# Patient Record
Sex: Male | Born: 1980 | Race: Black or African American | Hispanic: No | Marital: Married | Smoking: Former smoker
Health system: Southern US, Community
[De-identification: ages and names within clinical notes are randomized; demographics above are authoritative.]

## PROBLEM LIST (undated history)

## (undated) ENCOUNTER — Emergency Department (HOSPITAL_BASED_OUTPATIENT_CLINIC_OR_DEPARTMENT_OTHER): Admission: EM | Payer: BC Managed Care – PPO

## (undated) HISTORY — PX: CERVICAL FUSION: SHX112

## (undated) HISTORY — PX: BACK SURGERY: SHX140

---
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
## 2003-04-14 ENCOUNTER — Encounter: Payer: Self-pay | Admitting: Emergency Medicine

## 2003-04-14 ENCOUNTER — Emergency Department (HOSPITAL_COMMUNITY): Admission: EM | Admit: 2003-04-14 | Discharge: 2003-04-14 | Payer: Self-pay | Admitting: Emergency Medicine

## 2004-02-20 ENCOUNTER — Emergency Department (HOSPITAL_COMMUNITY): Admission: EM | Admit: 2004-02-20 | Discharge: 2004-02-20 | Payer: Self-pay | Admitting: Emergency Medicine

## 2007-08-11 ENCOUNTER — Emergency Department (HOSPITAL_COMMUNITY): Admission: EM | Admit: 2007-08-11 | Discharge: 2007-08-11 | Payer: Self-pay | Admitting: Emergency Medicine

## 2008-08-04 ENCOUNTER — Emergency Department (HOSPITAL_COMMUNITY): Admission: EM | Admit: 2008-08-04 | Discharge: 2008-08-04 | Payer: Self-pay | Admitting: Emergency Medicine

## 2008-08-05 ENCOUNTER — Emergency Department (HOSPITAL_COMMUNITY): Admission: EM | Admit: 2008-08-05 | Discharge: 2008-08-05 | Payer: Self-pay | Admitting: Emergency Medicine

## 2010-05-23 ENCOUNTER — Emergency Department (HOSPITAL_COMMUNITY): Admission: EM | Admit: 2010-05-23 | Discharge: 2010-05-23 | Payer: Self-pay | Admitting: Emergency Medicine

## 2010-06-20 ENCOUNTER — Emergency Department (HOSPITAL_COMMUNITY): Admission: EM | Admit: 2010-06-20 | Discharge: 2010-06-20 | Payer: Self-pay | Admitting: Emergency Medicine

## 2010-07-23 ENCOUNTER — Emergency Department (HOSPITAL_COMMUNITY)
Admission: EM | Admit: 2010-07-23 | Discharge: 2010-07-23 | Payer: Self-pay | Source: Home / Self Care | Admitting: Emergency Medicine

## 2010-10-26 LAB — URINALYSIS, ROUTINE W REFLEX MICROSCOPIC
Bilirubin Urine: NEGATIVE
Glucose, UA: NEGATIVE mg/dL
Hgb urine dipstick: NEGATIVE
Ketones, ur: NEGATIVE mg/dL
Nitrite: NEGATIVE
Protein, ur: NEGATIVE mg/dL
Specific Gravity, Urine: 1.028 (ref 1.005–1.030)
Urobilinogen, UA: 0.2 mg/dL (ref 0.0–1.0)
pH: 6 (ref 5.0–8.0)

## 2010-10-26 LAB — GC/CHLAMYDIA PROBE AMP, GENITAL
Chlamydia, DNA Probe: NEGATIVE
GC Probe Amp, Genital: NEGATIVE

## 2011-05-01 ENCOUNTER — Emergency Department (HOSPITAL_COMMUNITY)
Admission: EM | Admit: 2011-05-01 | Discharge: 2011-05-02 | Disposition: A | Discharge status: Home / Self Care | Payer: Self-pay | Attending: Emergency Medicine | Admitting: Emergency Medicine

## 2011-05-01 DIAGNOSIS — S1093XA Contusion of unspecified part of neck, initial encounter: Secondary | ICD-10-CM | POA: Insufficient documentation

## 2011-05-01 DIAGNOSIS — Y990 Civilian activity done for income or pay: Secondary | ICD-10-CM | POA: Insufficient documentation

## 2011-05-01 DIAGNOSIS — S0003XA Contusion of scalp, initial encounter: Secondary | ICD-10-CM | POA: Insufficient documentation

## 2011-05-01 DIAGNOSIS — Y9269 Other specified industrial and construction area as the place of occurrence of the external cause: Secondary | ICD-10-CM | POA: Insufficient documentation

## 2011-05-01 DIAGNOSIS — IMO0002 Reserved for concepts with insufficient information to code with codable children: Secondary | ICD-10-CM | POA: Insufficient documentation

## 2011-05-01 DIAGNOSIS — R51 Headache: Secondary | ICD-10-CM | POA: Insufficient documentation

## 2011-05-02 ENCOUNTER — Encounter (HOSPITAL_COMMUNITY): Payer: Self-pay

## 2011-05-02 ENCOUNTER — Emergency Department (HOSPITAL_COMMUNITY): Payer: Self-pay

## 2011-05-20 LAB — COMPREHENSIVE METABOLIC PANEL
ALT: 19
Albumin: 4.4
Alkaline Phosphatase: 38 — ABNORMAL LOW
Calcium: 10
GFR calc Af Amer: >60
Potassium: 4.1
Sodium: 142
Total Protein: 7.3

## 2011-05-20 LAB — OCCULT BLOOD X 1 CARD TO LAB, STOOL: Fecal Occult Bld: NEGATIVE

## 2011-05-20 LAB — CBC
Hemoglobin: 14.7 g/dL (ref 13.0–17.0)
Platelets: 189
RBC: 5.23 MIL/uL (ref 4.22–5.81)
RDW: 14.9 % (ref 11.5–15.5)
RDW: 13.8

## 2011-05-20 LAB — BASIC METABOLIC PANEL
Calcium: 9.8 mg/dL (ref 8.4–10.5)
GFR calc Af Amer: >60 mL/min (ref >60)
GFR calc Af Amer: >60 mL/min (ref >60)
GFR calc non Af Amer: >60 mL/min (ref >60)
GFR calc non Af Amer: >60 mL/min (ref >60)
Glucose, Bld: 115 mg/dL — ABNORMAL HIGH (ref 70–99)
Potassium: 4.3 mEq/L (ref 3.5–5.1)
Sodium: 139 mEq/L (ref 135–145)
Sodium: 144 mEq/L (ref 135–145)

## 2011-05-20 LAB — URINALYSIS, ROUTINE W REFLEX MICROSCOPIC
Hgb urine dipstick: NEGATIVE
Nitrite: NEGATIVE
Specific Gravity, Urine: 1.025 (ref 1.005–1.030)
Urobilinogen, UA: 1 mg/dL (ref 0.0–1.0)

## 2011-05-20 LAB — LIPASE, BLOOD: Lipase: 23 U/L (ref 11–59)

## 2011-05-20 LAB — DIFFERENTIAL
Basophils Absolute: 0 10*3/uL (ref 0.0–0.1)
Basophils Relative: 0
Eosinophils Absolute: 0
Eosinophils Relative: 0
Lymphocytes Relative: 6 % — ABNORMAL LOW (ref 12–46)
Monocytes Absolute: 1.5 10*3/uL — ABNORMAL HIGH (ref 0.1–1.0)
Monocytes Absolute: 1.9 — ABNORMAL HIGH
Neutro Abs: 13.6 10*3/uL — ABNORMAL HIGH (ref 1.7–7.7)
Neutro Abs: 21.5 — ABNORMAL HIGH

## 2011-05-20 LAB — URINE MICROSCOPIC-ADD ON

## 2011-09-02 ENCOUNTER — Emergency Department (HOSPITAL_COMMUNITY): Payer: Self-pay

## 2011-09-02 ENCOUNTER — Emergency Department (HOSPITAL_COMMUNITY)
Admission: EM | Admit: 2011-09-02 | Discharge: 2011-09-02 | Disposition: A | Discharge status: Home / Self Care | Payer: Self-pay | Attending: Emergency Medicine | Admitting: Emergency Medicine

## 2011-09-02 ENCOUNTER — Encounter (HOSPITAL_COMMUNITY): Payer: Self-pay | Admitting: *Deleted

## 2011-09-02 DIAGNOSIS — M25519 Pain in unspecified shoulder: Secondary | ICD-10-CM | POA: Insufficient documentation

## 2011-09-02 DIAGNOSIS — M79609 Pain in unspecified limb: Secondary | ICD-10-CM | POA: Insufficient documentation

## 2011-09-02 DIAGNOSIS — M25539 Pain in unspecified wrist: Secondary | ICD-10-CM | POA: Insufficient documentation

## 2011-09-02 DIAGNOSIS — IMO0001 Reserved for inherently not codable concepts without codable children: Secondary | ICD-10-CM | POA: Insufficient documentation

## 2011-09-02 MED ORDER — IBUPROFEN 800 MG PO TABS: 800.0000 mg | ORAL_TABLET | Freq: Three times a day (TID) | ORAL | Status: AC | PRN

## 2011-09-02 MED ORDER — OXYCODONE-ACETAMINOPHEN 5-325 MG PO TABS: 1.0000 | ORAL_TABLET | Freq: Four times a day (QID) | ORAL | Status: AC | PRN

## 2011-09-02 MED ORDER — IBUPROFEN 200 MG PO TABS
600.0000 mg | ORAL_TABLET | Freq: Once | ORAL | Status: AC
  Administered 2011-09-02: 600 mg via ORAL
  Filled 2011-09-02: qty 3

## 2011-09-02 MED ORDER — DIAZEPAM 5 MG PO TABS: 5.0000 mg | ORAL_TABLET | Freq: Three times a day (TID) | ORAL | Status: AC | PRN
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
## 2011-09-02 NOTE — Progress Notes (Signed)
Orthopedic Tech Progress Note Patient Details:  Roberto Goodman February 26, 1981 [id number]  Other Ortho Devices Type of Ortho Device: Other (comment) Ortho Device Location: immobilizer sling Ortho Device Interventions: Sarita Bottom 09/02/2011, 9:12 AM
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
## 2011-09-02 NOTE — ED Notes (Signed)
Patient transported to X-ray 

## 2011-09-10 NOTE — ED Provider Notes (Signed)
Medical screening examination/treatment/procedure(s) were conducted as a shared visit with non-physician practitioner(s) and myself.  I personally evaluated the patient during the encounter   Suzi Roots, MD 09/10/11 1247

## 2012-05-18 ENCOUNTER — Encounter (HOSPITAL_COMMUNITY): Payer: Self-pay | Admitting: Emergency Medicine

## 2012-05-18 ENCOUNTER — Emergency Department (HOSPITAL_COMMUNITY)
Admission: EM | Admit: 2012-05-18 | Discharge: 2012-05-18 | Disposition: A | Discharge status: Home / Self Care | Payer: Self-pay | Attending: Emergency Medicine | Admitting: Emergency Medicine

## 2012-05-18 DIAGNOSIS — G5620 Lesion of ulnar nerve, unspecified upper limb: Secondary | ICD-10-CM | POA: Insufficient documentation

## 2012-05-18 DIAGNOSIS — F17200 Nicotine dependence, unspecified, uncomplicated: Secondary | ICD-10-CM | POA: Insufficient documentation

## 2012-05-18 MED ORDER — IBUPROFEN 600 MG PO TABS: 600.0000 mg | ORAL_TABLET | Freq: Three times a day (TID) | ORAL | Status: AC

## 2012-05-18 NOTE — ED Provider Notes (Signed)
History   This chart was scribed for Roberto Munch, MD by Melba Coon. The patient was seen in room TR09C/TR09C and the patient's care was started at 1:21PM.    CSN: [id number]  Arrival date & time 05/18/12  1029   First MD Initiated Contact with Patient 05/18/12 1250      Chief Complaint  Patient presents with  . Wrist Pain    (Consider location/radiation/quality/duration/timing/severity/associated sxs/prior treatment) The history is provided by the patient. No language interpreter was used.   CRIMSON HELLE is a 31 y.o. male who presents to the Emergency Department complaining of constant, moderate to severe left wrist pain that radiates to the elbow and left hand, 3rd and 4th digits, with an onset this morning. 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 Moving the hand and wrist downward aggravates the pain. Denies HA, fever, neck pain, sore throat, rash, back pain, CP, SOB, abd pain, n/v/d, dysuria, or extremity edema, weakness, numbness, or tingling. No known allergies. No other pertinent medical symptoms.  History reviewed. No pertinent past medical history.  History reviewed. No pertinent past surgical history.  No family history on file.  History  Substance Use Topics  . Smoking status: Current Every Day Smoker  . Smokeless tobacco: Not on file  . Alcohol Use: Yes      Review of Systems 10 Systems reviewed and all are negative for acute change except as noted in the HPI.   Allergies  Review of patient's allergies indicates no known allergies.  Home Medications  No current outpatient prescriptions on file.  BP 110/59  Pulse 68  Temp 98.2 F (36.8 C)  Resp 16  SpO2 99%  Physical Exam  Nursing note and vitals reviewed. Constitutional: He is oriented to person, place, and time. He appears well-developed and well-nourished. No distress.  HENT:  Head: Normocephalic and atraumatic.    Right Ear: External ear normal.  Left Ear: External ear normal.  Eyes: EOM are normal.  Neck: Normal range of motion. Neck supple. No tracheal deviation present.  Cardiovascular: Normal rate, regular rhythm and normal heart sounds.   No murmur heard. Pulmonary/Chest: Effort normal and breath sounds normal. No respiratory distress. He has no wheezes.  Musculoskeletal: Normal range of motion.  Lymphadenopathy:    He has no cervical adenopathy.  Neurological: He is alert and oriented to person, place, and time.       Subjective sensation throughout the ulnar distribution. Sensation and strength 5/5 LUE.  Skin: Skin is warm and dry.  Psychiatric: He has a normal mood and affect. His behavior is normal.    ED Course  Procedures (including critical care time)   COORDINATION OF CARE:  1:26PM - ibuprofen will be Rx for Mr Jahoda and is advised to apply ice at home. He is ready for d/c.    Labs Reviewed - No data to display No results found.   No diagnosis found.    MDM  I personally performed the services described in this documentation, which was scribed in my presence. The recorded information has been reviewed and considered.  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  No clear precipitant, though his work is a consideration.  He was discharged in stable condition with anti-inflammatories, cryotherapy, return precautions and followup instructions.  Roberto Munch, MD 05/18/12 [phone]

## 2012-05-18 NOTE — ED Notes (Signed)
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

## 2012-09-07 ENCOUNTER — Encounter (HOSPITAL_COMMUNITY): Payer: Self-pay

## 2012-09-07 ENCOUNTER — Emergency Department (HOSPITAL_COMMUNITY)
Admission: EM | Admit: 2012-09-07 | Discharge: 2012-09-07 | Disposition: A | Discharge status: Home / Self Care | Payer: Self-pay | Source: Home / Self Care | Attending: Family Medicine | Admitting: Family Medicine

## 2012-09-07 DIAGNOSIS — H00019 Hordeolum externum unspecified eye, unspecified eyelid: Secondary | ICD-10-CM

## 2012-09-07 MED ORDER — DOXYCYCLINE HYCLATE 100 MG PO CAPS: 100.0000 mg | ORAL_CAPSULE | Freq: Two times a day (BID) | ORAL | Status: DC

## 2012-09-07 MED ORDER — POLYMYXIN B-TRIMETHOPRIM 10000-0.1 UNIT/ML-% OP SOLN: 1.0000 [drp] | OPHTHALMIC | Status: DC

## 2012-09-07 NOTE — ED Provider Notes (Signed)
History     CSN: [id number]  Arrival date & time 09/07/12  1735   First MD Initiated Contact with Patient 09/07/12 1834     Chief Complaint  Patient presents with  . Eye Pain    Patient is a 32 y.o. male presenting with eye pain. The history is provided by the patient.  Eye Pain  stye involving the left eye started 1 week ago, been using warm compresses but no improvement and getting bigger, not associated with pain but is starting to affect peripheral vision as pt can see it.  Pt says that he has no fever or chills.     History reviewed. No pertinent past medical history.  History reviewed. No pertinent past surgical history.  No family history on file.  History  Substance Use Topics  . Smoking status: Current Every Day Smoker  . Smokeless tobacco: Not on file  . Alcohol Use: Yes      Review of Systems  Eyes: Positive for redness and visual disturbance. Negative for pain.       Bump under left eye  All other systems reviewed and are negative.    Allergies  Review of patient's allergies indicates no known allergies.  Home Medications  No current outpatient prescriptions on file.  BP 106/56  Pulse 60  Temp 98.2 F (36.8 C) (Oral)  Resp 17  SpO2 99%  Physical Exam  Nursing note and vitals reviewed. Constitutional: He appears well-developed and well-nourished. No distress.  HENT:  Head: Normocephalic and atraumatic.  Eyes: EOM are normal. Pupils are equal, round, and reactive to light. Left eye exhibits no exudate. No foreign body present in the left eye.      ED Course  Procedures (including critical care time)  Labs Reviewed - No data to display No results found.   No diagnosis found.  MDM  IMPRESSION  Stye left eye  RECOMMENDATIONS / PLAN Doxycycline 100 mg po bid  polytrim eye drops  RTC if no improvement or worsening in 3 days...the patient verbalized understanding  FOLLOW UP 3 days if no improvement or worsening   The patient was  given clear instructions to go to ER or return to medical center if symptoms don't improve, worsen or new problems develop.  The patient verbalized understanding.  The patient was told to call to get lab results if they haven't heard anything in the next week.            Cleora Fleet, MD 09/07/12 1929

## 2012-09-07 NOTE — ED Notes (Signed)
Patient states left lower eye lid swollen almost a week denies pain , causing some blurred vision, swelling to left side of face

## 2012-09-15 ENCOUNTER — Encounter (HOSPITAL_COMMUNITY): Payer: Self-pay | Admitting: Emergency Medicine

## 2012-09-15 ENCOUNTER — Emergency Department (INDEPENDENT_AMBULATORY_CARE_PROVIDER_SITE_OTHER)
Admission: EM | Admit: 2012-09-15 | Discharge: 2012-09-15 | Disposition: A | Discharge status: Home / Self Care | Payer: Self-pay | Source: Home / Self Care | Attending: Emergency Medicine | Admitting: Emergency Medicine

## 2012-09-15 DIAGNOSIS — H00029 Hordeolum internum unspecified eye, unspecified eyelid: Secondary | ICD-10-CM

## 2012-09-15 DIAGNOSIS — H00019 Hordeolum externum unspecified eye, unspecified eyelid: Secondary | ICD-10-CM

## 2012-09-15 MED ORDER — ERYTHROMYCIN 5 MG/GM OP OINT: TOPICAL_OINTMENT | Freq: Four times a day (QID) | OPHTHALMIC | Status: DC

## 2012-09-15 NOTE — ED Provider Notes (Signed)
Medical screening examination/treatment/procedure(s) were performed by non-physician practitioner and as supervising physician I was immediately available for consultation/collaboration.  Leslee Home, M.D.   Reuben Likes, MD 09/15/12 [phone]

## 2012-09-15 NOTE — ED Provider Notes (Signed)
History     CSN: [id number]  Arrival date & time 09/15/12  1155   First MD Initiated Contact with Patient 09/15/12 1424      Chief Complaint  Patient presents with  . Facial Swelling    left lower eye lid swelling.     (Consider location/radiation/quality/duration/timing/severity/associated sxs/prior treatment) Patient is a 32 y.o. male presenting with eye pain. The history is provided by the patient.  Eye Pain This is a new problem. The problem has not changed since onset.Treatments tried: oral antibiotics.  Pt presents for follow up of left eye stye.  States currently on doxycyline and polymyxin as previously prescribed with no change in symptoms for one week.  Has additionally applied warm compresses.  Denies previous history of same.  Not diabetic, no previous eye surgery/trauma, denies cataracts or glaucoma. No floaters or flashing lights No vision loss + blurred vision No eye pain + eyelid itching + tearing No headache/scalp tenderness Does not wear contacts or glasses.  History reviewed. No pertinent past medical history.  History reviewed. No pertinent past surgical history.  History reviewed. No pertinent family history.  History  Substance Use Topics  . Smoking status: Current Every Day Smoker  . Smokeless tobacco: Not on file  . Alcohol Use: Yes      Review of Systems  Eyes: Positive for pain, redness, itching and visual disturbance. Negative for discharge.  All other systems reviewed and are negative.    Allergies  Review of patient's allergies indicates no known allergies.  Home Medications   Current Outpatient Rx  Name  Route  Sig  Dispense  Refill  . DOXYCYCLINE HYCLATE 100 MG PO CAPS   Oral   Take 1 capsule (100 mg total) by mouth 2 (two) times daily with a meal.   14 capsule   0   . ERYTHROMYCIN 5 MG/GM OP OINT   Left Eye   Place into the left eye every 6 (six) hours. For seven days   3.5 g   0     BP 117/73  Pulse 71  Temp 98.1  F (36.7 C) (Oral)  Resp 17  SpO2 98%  Physical Exam  Nursing note and vitals reviewed. Constitutional: He is oriented to person, place, and time. Vital signs are normal. He appears well-developed and well-nourished. He is active and cooperative.  HENT:  Head: Normocephalic.  Eyes: Conjunctivae normal and EOM are normal. Pupils are equal, round, and reactive to light. No foreign bodies found. Right eye exhibits no chemosis, no discharge, no exudate and no hordeolum. No foreign body present in the right eye. Left eye exhibits hordeolum. Left eye exhibits no chemosis, no discharge and no exudate. No foreign body present in the left eye. No scleral icterus.    Neck: Trachea normal. Neck supple.  Cardiovascular: Normal rate, regular rhythm and normal heart sounds.   Pulmonary/Chest: Effort normal and breath sounds normal.  Neurological: He is alert and oriented to person, place, and time. He has normal strength. No cranial nerve deficit or sensory deficit. GCS eye subscore is 4. GCS verbal subscore is 5. GCS motor subscore is 6.  Skin: Skin is warm and dry.  Psychiatric: He has a normal mood and affect. His speech is normal and behavior is normal. Judgment and thought content normal. Cognition and memory are normal.    ED Course  Procedures (including critical care time)  Labs Reviewed - No data to display No results found.   1. Hordeolum eyelid, internal  MDM  Cleanse eyelid with baby shampoo.  Apply warm compresses four times daily. Follow-up with ophthalmologist within 1-2 days if the condition is not resolved completely with prescribed management.topical antibiotic         Johnsie Kindred, NP 09/15/12 1501

## 2012-09-15 NOTE — ED Notes (Signed)
Pt was seen on 1/24 for left lower eye lid swelling. Pt states that he is taking medication as prescribed with no relief in symptoms. Pt states that it is gradually getting worse and having some irritation, denies drainage.

## 2012-09-15 NOTE — ED Notes (Signed)
Waiting discharge papers 

## 2012-12-23 ENCOUNTER — Emergency Department (HOSPITAL_COMMUNITY)
Admission: EM | Admit: 2012-12-23 | Discharge: 2012-12-23 | Disposition: A | Discharge status: Home / Self Care | Payer: Medicaid Other | Attending: Emergency Medicine | Admitting: Emergency Medicine

## 2012-12-23 ENCOUNTER — Encounter (HOSPITAL_COMMUNITY): Payer: Self-pay | Admitting: *Deleted

## 2012-12-23 DIAGNOSIS — R112 Nausea with vomiting, unspecified: Secondary | ICD-10-CM | POA: Insufficient documentation

## 2012-12-23 DIAGNOSIS — R197 Diarrhea, unspecified: Secondary | ICD-10-CM | POA: Insufficient documentation

## 2012-12-23 DIAGNOSIS — K529 Noninfective gastroenteritis and colitis, unspecified: Secondary | ICD-10-CM

## 2012-12-23 DIAGNOSIS — F17200 Nicotine dependence, unspecified, uncomplicated: Secondary | ICD-10-CM | POA: Insufficient documentation

## 2012-12-23 DIAGNOSIS — K5289 Other specified noninfective gastroenteritis and colitis: Secondary | ICD-10-CM | POA: Insufficient documentation

## 2012-12-23 LAB — CBC WITH DIFFERENTIAL/PLATELET
Basophils Absolute: 0 10*3/uL (ref 0.0–0.1)
Eosinophils Absolute: 0 10*3/uL (ref 0.0–0.7)
Eosinophils Relative: 0 % (ref 0–5)
HCT: 40 % (ref 39.0–52.0)
Lymphocytes Relative: 8 % — ABNORMAL LOW (ref 12–46)
MCH: 27.7 pg (ref 26.0–34.0)
MCV: 77.5 fL — ABNORMAL LOW (ref 78.0–100.0)
Monocytes Absolute: 0.7 10*3/uL (ref 0.1–1.0)
Platelets: 220 10*3/uL (ref 150–400)
RDW: 13.9 % (ref 11.5–15.5)

## 2012-12-23 LAB — URINALYSIS, ROUTINE W REFLEX MICROSCOPIC
Nitrite: NEGATIVE
Protein, ur: NEGATIVE mg/dL
Specific Gravity, Urine: 1.025 (ref 1.005–1.030)
Urobilinogen, UA: 0.2 mg/dL (ref 0.0–1.0)

## 2012-12-23 LAB — URINE MICROSCOPIC-ADD ON

## 2012-12-23 LAB — COMPREHENSIVE METABOLIC PANEL
ALT: 18 U/L (ref 0–53)
CO2: 23 mEq/L (ref 19–32)
Calcium: 10.2 mg/dL (ref 8.4–10.5)
Creatinine, Ser: 0.88 mg/dL (ref 0.50–1.35)
GFR calc Af Amer: >90 mL/min (ref >90)
GFR calc non Af Amer: >90 mL/min (ref >90)
Glucose, Bld: 150 mg/dL — ABNORMAL HIGH (ref 70–99)
Sodium: 138 mEq/L (ref 135–145)
Total Protein: 7.7 g/dL (ref 6.0–8.3)

## 2012-12-23 MED ORDER — SODIUM CHLORIDE 0.9 % IV BOLUS (SEPSIS): 1000.0000 mL | Freq: Once | INTRAVENOUS | Status: DC

## 2012-12-23 MED ORDER — ONDANSETRON HCL 8 MG PO TABS: 8.0000 mg | ORAL_TABLET | Freq: Three times a day (TID) | ORAL | Status: DC | PRN

## 2012-12-23 MED ORDER — ONDANSETRON 4 MG PO TBDP
4.0000 mg | ORAL_TABLET | Freq: Once | ORAL | Status: AC
  Administered 2012-12-23: 4 mg via ORAL
  Filled 2012-12-23: qty 1

## 2012-12-23 MED ORDER — SODIUM CHLORIDE 0.9 % IV BOLUS (SEPSIS)
1000.0000 mL | Freq: Once | INTRAVENOUS | Status: AC
  Administered 2012-12-23: 1000 mL via INTRAVENOUS

## 2012-12-23 MED ORDER — HYDROMORPHONE HCL PF 1 MG/ML IJ SOLN
1.0000 mg | Freq: Once | INTRAMUSCULAR | Status: AC
  Administered 2012-12-23: 1 mg via INTRAVENOUS
  Filled 2012-12-23: qty 1

## 2012-12-23 MED ORDER — ONDANSETRON HCL 4 MG/2ML IJ SOLN
4.0000 mg | Freq: Once | INTRAMUSCULAR | Status: AC
  Administered 2012-12-23: 4 mg via INTRAVENOUS
  Filled 2012-12-23: qty 2

## 2012-12-23 NOTE — ED Provider Notes (Signed)
History     CSN: [id number]  Arrival date & time 12/23/12  4098   First MD Initiated Contact with Patient 12/23/12 1047      Chief Complaint  Patient presents with  . Abdominal Pain    (Consider location/radiation/quality/duration/timing/severity/associated sxs/prior treatment) Patient is a 32 y.o. male presenting with abdominal pain. The history is provided by the patient.  Abdominal Pain Associated symptoms: diarrhea and vomiting   Associated symptoms: no chest pain, no chills, no fever and no shortness of breath   pt c/o nvd onset yesterday afternoon. Several episodes of both vomiting and diarrhea. Emesis clear, or color recently ingested fluids, not bloody or bilious. Diarrhea watery, not bloody. Diffuse, intermittent, abd cramping/pain, no constant or focal abd pain. No back/flank pain. No dysuria or gu c/o. No recent travel, known bad food ingestion, recent ill contacts, no abx use. No cough or uri c/o. No cp or sob. No prior abd surgery, no abd distension.    History reviewed. No pertinent past medical history.  History reviewed. No pertinent past surgical history.  No family history on file.  History  Substance Use Topics  . Smoking status: Current Every Day Smoker  . Smokeless tobacco: Not on file  . Alcohol Use: Yes      Review of Systems  Constitutional: Negative for fever and chills.  HENT: Negative for neck pain.   Eyes: Negative for redness.  Respiratory: Negative for shortness of breath.   Cardiovascular: Negative for chest pain.  Gastrointestinal: Positive for vomiting, abdominal pain and diarrhea.  Genitourinary: Negative for flank pain.  Musculoskeletal: Negative for back pain.  Skin: Negative for rash.  Neurological: Negative for headaches.  Hematological: Does not bruise/bleed easily.  Psychiatric/Behavioral: Negative for confusion.    Allergies  Review of patient's allergies indicates no known allergies.  Home Medications  No current  outpatient prescriptions on file.  BP 126/68  Pulse 64  Temp(Src) 98.3 F (36.8 C) (Oral)  Resp 24  Ht 5\' 7"  (1.702 m)  Wt 142 lb (64.411 kg)  BMI 22.24 kg/m2  SpO2 100%  Physical Exam  Nursing note and vitals reviewed. Constitutional: He is oriented to person, place, and time. He appears well-developed and well-nourished. No distress.  HENT:  Nose: Nose normal.  Mouth/Throat: Oropharynx is clear and moist.  Eyes: Conjunctivae are normal. No scleral icterus.  Neck: Neck supple. No tracheal deviation present.  No stiffness or rigidity  Cardiovascular: Normal rate, regular rhythm, normal heart sounds and intact distal pulses.  Exam reveals no gallop and no friction rub.   No murmur heard. Pulmonary/Chest: Effort normal and breath sounds normal. No accessory muscle usage. No respiratory distress.  Abdominal: Soft. Bowel sounds are normal. He exhibits no distension and no mass. There is no tenderness. There is no rebound and no guarding.  Genitourinary:  No cva tenderness  Musculoskeletal: Normal range of motion. He exhibits no edema and no tenderness.  Lymphadenopathy:    He has no cervical adenopathy.  Neurological: He is alert and oriented to person, place, and time.  Skin: Skin is warm and dry. No rash noted.  Psychiatric: He has a normal mood and affect.    ED Course  Procedures (including critical care time)   Results for orders placed during the hospital encounter of 12/23/12  CBC WITH DIFFERENTIAL      Result Value Range   WBC 17.2 (*) 4.0 - 10.5 K/uL   RBC 5.16  4.22 - 5.81 MIL/uL   Hemoglobin 14.3  13.0 - 17.0 g/dL   HCT 47.8  29.5 - 62.1 %   MCV 77.5 (*) 78.0 - 100.0 fL   MCH 27.7  26.0 - 34.0 pg   MCHC 35.8  30.0 - 36.0 g/dL   RDW 30.8  65.7 - 84.6 %   Platelets 220  150 - 400 K/uL   Neutrophils Relative 88 (*) 43 - 77 %   Neutro Abs 15.1 (*) 1.7 - 7.7 K/uL   Lymphocytes Relative 8 (*) 12 - 46 %   Lymphs Abs 1.4  0.7 - 4.0 K/uL   Monocytes Relative 4  3  - 12 %   Monocytes Absolute 0.7  0.1 - 1.0 K/uL   Eosinophils Relative 0  0 - 5 %   Eosinophils Absolute 0.0  0.0 - 0.7 K/uL   Basophils Relative 0  0 - 1 %   Basophils Absolute 0.0  0.0 - 0.1 K/uL  COMPREHENSIVE METABOLIC PANEL      Result Value Range   Sodium 138  135 - 145 mEq/L   Potassium 4.1  3.5 - 5.1 mEq/L   Chloride 101  96 - 112 mEq/L   CO2 23  19 - 32 mEq/L   Glucose, Bld 150 (*) 70 - 99 mg/dL   BUN 15  6 - 23 mg/dL   Creatinine, Ser 9.62  0.50 - 1.35 mg/dL   Calcium 95.2  8.4 - 84.1 mg/dL   Total Protein 7.7  6.0 - 8.3 g/dL   Albumin 4.7  3.5 - 5.2 g/dL   AST 23  0 - 37 U/L   ALT 18  0 - 53 U/L   Alkaline Phosphatase 44  39 - 117 U/L   Total Bilirubin 1.2  0.3 - 1.2 mg/dL   GFR calc non Af Amer >90  >90 mL/min   GFR calc Af Amer >90  >90 mL/min  LIPASE, BLOOD      Result Value Range   Lipase 16  11 - 59 U/L      MDM  Iv ns bolus. zofran iv.  Dilaudid 1 mg iv for pain.  Reviewed nursing notes and prior charts for additional history.   Recheck pt comfortable.  abd soft nt on recheck.  Additional ivf.  Po fluids.  No recurrent nvd.   Pt appears stable for d/c.         Suzi Roots, MD 12/23/12 [phone]

## 2012-12-23 NOTE — ED Notes (Signed)
Pt is here with lower abdominal pain, vomiting, and diarrhea that started yesterday.  Pt states he feels weak

## 2012-12-23 NOTE — ED Notes (Signed)
Family at bedside. 

## 2012-12-24 ENCOUNTER — Encounter (HOSPITAL_COMMUNITY): Payer: Self-pay | Admitting: *Deleted

## 2012-12-24 ENCOUNTER — Emergency Department (HOSPITAL_COMMUNITY): Payer: Medicaid Other

## 2012-12-24 ENCOUNTER — Inpatient Hospital Stay (HOSPITAL_COMMUNITY)
Admission: EM | Admit: 2012-12-24 | Discharge: 2012-12-26 | DRG: 392 | Disposition: A | Discharge status: Home / Self Care | Payer: Medicaid Other | Attending: General Surgery | Admitting: General Surgery

## 2012-12-24 DIAGNOSIS — K5289 Other specified noninfective gastroenteritis and colitis: Principal | ICD-10-CM | POA: Diagnosis present

## 2012-12-24 DIAGNOSIS — R1011 Right upper quadrant pain: Secondary | ICD-10-CM

## 2012-12-24 DIAGNOSIS — F1210 Cannabis abuse, uncomplicated: Secondary | ICD-10-CM | POA: Diagnosis present

## 2012-12-24 DIAGNOSIS — F17200 Nicotine dependence, unspecified, uncomplicated: Secondary | ICD-10-CM | POA: Diagnosis present

## 2012-12-24 LAB — POCT I-STAT TROPONIN I: Troponin i, poc: 0 ng/mL (ref 0.00–0.08)

## 2012-12-24 LAB — RAPID URINE DRUG SCREEN, HOSP PERFORMED
Amphetamines: NOT DETECTED
Tetrahydrocannabinol: POSITIVE — AB

## 2012-12-24 LAB — URINE MICROSCOPIC-ADD ON

## 2012-12-24 LAB — URINALYSIS, ROUTINE W REFLEX MICROSCOPIC
Glucose, UA: NEGATIVE mg/dL
Hgb urine dipstick: NEGATIVE
Ketones, ur: 40 mg/dL — AB
pH: 7.5 (ref 5.0–8.0)

## 2012-12-24 LAB — CBC WITH DIFFERENTIAL/PLATELET
Basophils Absolute: 0 10*3/uL (ref 0.0–0.1)
Basophils Relative: 0 % (ref 0–1)
Eosinophils Absolute: 0 10*3/uL (ref 0.0–0.7)
MCH: 27.5 pg (ref 26.0–34.0)
MCHC: 35.1 g/dL (ref 30.0–36.0)
Monocytes Relative: 7 % (ref 3–12)
Neutro Abs: 14.7 10*3/uL — ABNORMAL HIGH (ref 1.7–7.7)
Neutrophils Relative %: 85 % — ABNORMAL HIGH (ref 43–77)
Platelets: 224 10*3/uL (ref 150–400)
RDW: 14.5 % (ref 11.5–15.5)

## 2012-12-24 LAB — COMPREHENSIVE METABOLIC PANEL
AST: 28 U/L (ref 0–37)
Albumin: 4.1 g/dL (ref 3.5–5.2)
Alkaline Phosphatase: 38 U/L — ABNORMAL LOW (ref 39–117)
BUN: 12 mg/dL (ref 6–23)
Potassium: 3.4 mEq/L — ABNORMAL LOW (ref 3.5–5.1)
Sodium: 140 mEq/L (ref 135–145)
Total Protein: 7 g/dL (ref 6.0–8.3)

## 2012-12-24 LAB — OCCULT BLOOD, POC DEVICE: Fecal Occult Bld: NEGATIVE

## 2012-12-24 LAB — LIPASE, BLOOD: Lipase: 28 U/L (ref 11–59)

## 2012-12-24 MED ORDER — HYDROMORPHONE HCL PF 1 MG/ML IJ SOLN
1.0000 mg | Freq: Once | INTRAMUSCULAR | Status: AC
  Administered 2012-12-24: 1 mg via INTRAVENOUS
  Filled 2012-12-24: qty 1

## 2012-12-24 MED ORDER — SODIUM CHLORIDE 0.9 % IV BOLUS (SEPSIS)
1000.0000 mL | Freq: Once | INTRAVENOUS | Status: AC
  Administered 2012-12-24: 1000 mL via INTRAVENOUS

## 2012-12-24 MED ORDER — PANTOPRAZOLE SODIUM 40 MG IV SOLR
40.0000 mg | Freq: Every day | INTRAVENOUS | Status: DC
  Administered 2012-12-25 (×2): 40 mg via INTRAVENOUS
  Filled 2012-12-24 (×4): qty 40

## 2012-12-24 MED ORDER — ACETAMINOPHEN 325 MG PO TABS: 650.0000 mg | ORAL_TABLET | Freq: Four times a day (QID) | ORAL | Status: DC | PRN

## 2012-12-24 MED ORDER — ONDANSETRON HCL 4 MG/2ML IJ SOLN
4.0000 mg | Freq: Once | INTRAMUSCULAR | Status: AC
  Administered 2012-12-24: 4 mg via INTRAVENOUS
  Filled 2012-12-24: qty 2

## 2012-12-24 MED ORDER — SODIUM CHLORIDE 0.9 % IV SOLN: 80.0000 mg | Freq: Once | INTRAVENOUS | Status: DC

## 2012-12-24 MED ORDER — IOHEXOL 300 MG/ML  SOLN
25.0000 mL | INTRAMUSCULAR | Status: AC
  Administered 2012-12-24 (×2): 25 mL via ORAL

## 2012-12-24 MED ORDER — HYDROMORPHONE HCL PF 1 MG/ML IJ SOLN
1.0000 mg | INTRAMUSCULAR | Status: DC | PRN
  Administered 2012-12-24: 1 mg via INTRAVENOUS
  Filled 2012-12-24: qty 1

## 2012-12-24 MED ORDER — IOHEXOL 300 MG/ML  SOLN
100.0000 mL | Freq: Once | INTRAMUSCULAR | Status: AC | PRN
  Administered 2012-12-24: 100 mL via INTRAVENOUS

## 2012-12-24 MED ORDER — PIPERACILLIN-TAZOBACTAM 3.375 G IVPB
3.3750 g | Freq: Three times a day (TID) | INTRAVENOUS | Status: DC
  Administered 2012-12-24 – 2012-12-26 (×5): 3.375 g via INTRAVENOUS
  Filled 2012-12-24 (×9): qty 50

## 2012-12-24 MED ORDER — SODIUM CHLORIDE 0.9 % IV SOLN
INTRAVENOUS | Status: DC
  Administered 2012-12-25 – 2012-12-26 (×4): via INTRAVENOUS

## 2012-12-24 MED ORDER — METRONIDAZOLE IVPB CUSTOM
2000.0000 mg | INTRAVENOUS | Status: AC
  Administered 2012-12-24: 2000 mg via INTRAVENOUS
  Filled 2012-12-24: qty 400

## 2012-12-24 MED ORDER — GI COCKTAIL ~~LOC~~
30.0000 mL | Freq: Once | ORAL | Status: AC
  Administered 2012-12-24: 30 mL via ORAL
  Filled 2012-12-24: qty 30

## 2012-12-24 MED ORDER — ONDANSETRON HCL 4 MG/2ML IJ SOLN: 4.0000 mg | Freq: Four times a day (QID) | INTRAMUSCULAR | Status: DC | PRN

## 2012-12-24 MED ORDER — FAMOTIDINE IN NACL 20-0.9 MG/50ML-% IV SOLN
20.0000 mg | Freq: Once | INTRAVENOUS | Status: AC
  Administered 2012-12-24: 20 mg via INTRAVENOUS
  Filled 2012-12-24: qty 50

## 2012-12-24 MED ORDER — HYDROMORPHONE HCL PF 1 MG/ML IJ SOLN
1.0000 mg | INTRAMUSCULAR | Status: DC | PRN
  Administered 2012-12-25 (×3): 1 mg via INTRAVENOUS
  Filled 2012-12-24 (×3): qty 1

## 2012-12-24 MED ORDER — HEPARIN SODIUM (PORCINE) 5000 UNIT/ML IJ SOLN
5000.0000 [IU] | Freq: Three times a day (TID) | INTRAMUSCULAR | Status: DC
  Administered 2012-12-25 – 2012-12-26 (×5): 5000 [IU] via SUBCUTANEOUS
  Filled 2012-12-24 (×8): qty 1

## 2012-12-24 MED ORDER — ACETAMINOPHEN 650 MG RE SUPP: 650.0000 mg | Freq: Four times a day (QID) | RECTAL | Status: DC | PRN

## 2012-12-24 NOTE — ED Provider Notes (Signed)
History     CSN: [id number] Arrival date & time 12/24/12  4098  First MD Initiated Contact with Patient 12/24/12 [phone]    Chief Complaint  Patient presents with  . Abdominal Pain   HPI Comments: 32 yo otherwise healthy male presenting with diffuse abdominal pain worse in upper abdomen.  Was seen yesterday for similar + vomiting and dx with gastroenteritis.  Now worsening pain but no further vomiting.  Reports 1 dark foul smelling BM yesterday but no reoccurance.  No blood streaked emesis.  No known hx of PUD but does report some GERD like symptoms.  Denies NSAID/Goodies powder use.    Patient is a 32 y.o. male presenting with abdominal pain.  Abdominal Pain Pain location:  Epigastric and periumbilical Pain quality: aching, burning and gnawing   Pain quality: not cramping, not sharp, not shooting, not squeezing, not stabbing, not tearing and not throbbing   Pain radiates to:  Does not radiate Pain severity:  Severe (10/10) Onset quality:  Gradual Duration:  48 hours Timing:  Constant Progression:  Worsening Chronicity:  New Context: retching   Context: not alcohol use, not eating, not previous surgeries and not recent illness   Relieved by:  Nothing Worsened by:  Nothing tried Associated symptoms: anorexia, melena (dark foul smelling stool X 1 yesterday) and vomiting (yesterday but resolved today)   Associated symptoms: no chest pain, no chills, no cough, no diarrhea, no dysuria, no fatigue, no fever, no hematemesis, no hematochezia, no nausea and no shortness of breath   Risk factors: no alcohol abuse and no NSAID use     History reviewed. No pertinent past medical history.  History reviewed. No pertinent past surgical history.  No family history on file.  History  Substance Use Topics  . Smoking status: Current Every Day Smoker  . Smokeless tobacco: Not on file  . Alcohol Use: Yes    Review of Systems  Constitutional: Negative for fever, chills and fatigue.  Eyes:  Negative.   Respiratory: Negative for cough, chest tightness and shortness of breath.   Cardiovascular: Negative for chest pain.  Gastrointestinal: Positive for vomiting (yesterday but resolved today), abdominal pain, melena (dark foul smelling stool X 1 yesterday) and anorexia. Negative for nausea, diarrhea, blood in stool, hematochezia, anal bleeding and hematemesis.  Genitourinary: Negative for dysuria, urgency, frequency, flank pain and decreased urine volume.  Musculoskeletal: Negative for back pain.  Skin: Negative.   Allergic/Immunologic: Negative.   Neurological: Negative.  Negative for syncope and weakness.  Psychiatric/Behavioral: Negative for behavioral problems, confusion and agitation.    Allergies  Review of patient's allergies indicates no known allergies.  Home Medications   Current Outpatient Rx  Name  Route  Sig  Dispense  Refill  . ondansetron (ZOFRAN) 8 MG tablet   Oral   Take 1 tablet (8 mg total) by mouth every 8 (eight) hours as needed for nausea.   8 tablet   0     BP 136/81  Pulse 77  Temp(Src) 97.8 F (36.6 C) (Oral)  Resp 14  SpO2 100%  Physical Exam  Nursing note and vitals reviewed. Constitutional: He appears well-developed and well-nourished. He appears distressed (moderately uncomfortable.  No respiratory distress.).  HENT:  Head: Normocephalic and atraumatic.  Mouth/Throat: No oropharyngeal exudate.  Mildly dry mucous membranes  Eyes: Conjunctivae are normal. Right eye exhibits no discharge. Left eye exhibits no discharge. No scleral icterus.  Neck: No JVD present. No tracheal deviation present.  Cardiovascular: Normal rate, regular rhythm,  normal heart sounds and intact distal pulses.  Exam reveals no gallop and no friction rub.   No murmur heard. Pulmonary/Chest: Effort normal and breath sounds normal. No respiratory distress. He has no wheezes. He has no rales.  Abdominal: Soft. He exhibits no distension and no mass. There is tenderness  (severe diffusely with worse in upper hemi-abdomen). There is rebound and guarding.  No rigidity  Genitourinary: Rectum normal. Rectal exam shows no internal hemorrhoid, no fissure, no mass and no tenderness. Prostate is not enlarged and not tender. Circumcised. No phimosis, paraphimosis, hypospadias, penile erythema or penile tenderness. Discharge (minimal urethral) found.  Musculoskeletal: Normal range of motion. He exhibits no edema and no tenderness.  Neurological: He is alert. He exhibits normal muscle tone.  Skin: Skin is warm and dry. No rash noted. He is not diaphoretic. No erythema. No pallor.  Psychiatric: His behavior is normal. Judgment and thought content normal.  uncomfortable    ED Course  Procedures (including critical care time)  Labs Reviewed  CBC WITH DIFFERENTIAL - Abnormal; Notable for the following:    WBC 17.2 (*)    HCT 38.5 (*)    Neutrophils Relative 85 (*)    Neutro Abs 14.7 (*)    Lymphocytes Relative 8 (*)    Monocytes Absolute 1.2 (*)    All other components within normal limits  COMPREHENSIVE METABOLIC PANEL - Abnormal; Notable for the following:    Potassium 3.4 (*)    Glucose, Bld 129 (*)    Alkaline Phosphatase 38 (*)    All other components within normal limits  URINALYSIS, ROUTINE W REFLEX MICROSCOPIC - Abnormal; Notable for the following:    Ketones, ur 40 (*)    Leukocytes, UA SMALL (*)    All other components within normal limits  URINE RAPID DRUG SCREEN (HOSP PERFORMED) - Abnormal; Notable for the following:    Tetrahydrocannabinol POSITIVE (*)    All other components within normal limits  URINE CULTURE  GC/CHLAMYDIA PROBE AMP  LIPASE, BLOOD  URINE MICROSCOPIC-ADD ON  POCT I-STAT TROPONIN I  OCCULT BLOOD, POC DEVICE   US Abdomen Complete  12/24/2012  *RADIOLOGY REPORT*  Clinical Data:  Diffuse abdominal pain, right upper quadrant pain, pericholecystic fluid on preceding CT  ULTRASOUND ABDOMEN:  Technique:  Sonography of upper abdominal  structures was performed.  Comparison:  CT abdomen and pelvis 12/24/2012  Gallbladder:  Small amount of pericholecystic fluid.  Gallbladder wall appears minimally thickened.  No discrete gallstones or sonographic Murphy's sign identified.  Common bile duct:  Normal caliber 3 mm diameter appear  Liver:  Mid normal appearance.  No intrahepatic biliary dilatation.  IVC:  Normal appearance  Pancreas:  Normal appearance  Spleen:  Normal appearance, 5.8 cm length.  Right kidney:  10.7 cm length. Normal morphology without mass or hydronephrosis.  Left kidney:  10.9 cm length. Normal morphology without mass or hydronephrosis.  Aorta:  Normal caliber  Other:  Minimal pericholecystic fluid.  Small amounts of ascites were also present around the liver and in the pelvis on the preceding CT.  IMPRESSION: Mild gallbladder wall thickening, nonspecific in the setting of ascites. No discrete gallstones, sonographic Murphy's sign, or biliary dilatation identified.   Original Report Authenticated By: Ulyses Southward, M.D.    Ct Abdomen Pelvis W Contrast  12/24/2012  *RADIOLOGY REPORT*  Clinical Data: Abdominal pain, concern for appendicitis.  CT ABDOMEN AND PELVIS WITH CONTRAST  Technique:  Multidetector CT imaging of the abdomen and pelvis was performed following the  standard protocol during bolus administration of intravenous contrast.  Contrast: OMNIPAQUE IOHEXOL 300 MG/ML  SOLN  Comparison: CT abdomen 08/05/2008  Findings: Lung bases are clear.  No pericardial fluid.  There is no focal hepatic lesion.  There is periportal edema likely related to recent IV therapy.  There is pericholecystic fluid which also may relate to IV fluids.  Gallbladder wall is not thickened. The gallbladder is not distended.  The pancreas, spleen, adrenal glands, kidneys are normal.  The stomach, small bowel, cecum, and appendix are normal.  The appendix fills with gas and contrast and is not inflamed.  No abnormality of the  colon or rectosigmoid  colon. There the colon is poorly evaluated in part due to the lack of intra-abdominal fat.  Abdominal aorta is normal caliber.  No retroperitoneal periportal lymphadenopathy.  No free fluid the pelvis.  No distal ureteral stones or bladder stones.  Prostate gland appears normal.  No pelvic lymphadenopathy. Review of  bone windows demonstrates no aggressive osseous lesions.  IMPRESSION:  1.  Normal appendix 2.  Pericholecystic fluid likely relates to IV therapy but cannot include exclude cholecystitis.  Recommend correlation. 3.  Periportal edema likely relates to IV hydration therapy. 4.  No clear explanation for abdominal pain and elevated white blood cell count.   Original Report Authenticated By: Genevive Bi, M.D.    Dg Abd Acute W/chest  12/24/2012  *RADIOLOGY REPORT*  Clinical Data: 32 year old male with abdominal pain and vomiting.  ACUTE ABDOMEN SERIES (ABDOMEN 2 VIEW & CHEST 1 VIEW)  Comparison: CT abdomen and pelvis 08/05/2008.  Findings: There might be mild pectus deformity explaining subtle asymmetric increased right infrahilar opacity, on the upright view of the abdomen and medial right lung base appears normal. Elsewhere the lungs are clear. Normal cardiac size and mediastinal contours.  Visualized tracheal air column is within normal limits. No pneumothorax or pneumoperitoneum.  Nonobstructed bowel gas pattern.  Abdominal and pelvic visceral contours are within normal limits.  Occasionally EKG lead button artifact. No osseous abnormality identified.  IMPRESSION: Nonobstructed bowel gas pattern, no free air. Negative chest.   Original Report Authenticated By: Erskine Speed, M.D.    No diagnosis found.   MDM  1015 - Pt seen yesterday with gastritis.  Worsening pain after d/c.  Will start with GI cocktail, repeat labs given white count, collect repeat UA + Culture given prior Leukocytes.  Will check hemoccult given melanotic stool yesterday.  Concern for perforated ulcer.  Will check acute  abdominal series.  If negative will get CT abdomen/pelvis with contrast.  If positive free air will consult surgery.    1145 - Pt still uncomfortable.  Plain film negative for free air.  Will get abdominal CT with contrast for eval for Appendicitis.  Leukocytosis on repeat CBC.    1525 - Pain minimally improved from 10/10 > 8.5/10.  Has received 2L NS.  CT abdomen with pericholecystic fluid, normal appendix.  Consider acute cholecystitis.  Given finding will emperically start Zosyn.  Eval with RUQ [place].  Trichomonas on Urinalysis, get first void GC Chlamydia.  Treat with 2g Flagyl. Does not meet SIRS criteria so will defer blood cultures.  Administer Dilaudid and Zofran.  NPO   1650 - Pt still uncomfortable.  Has been unable to produce BM and missed void > 1 hour ago.  Rectal performed with hemoccult and urethral swab GC/Chlamydia collected.  [place] with thickened gallbladder.  Consult to general surgery given persistent pain and US/CT findings.  Negative  Fecal occult.  42 - Dr. Dwain Sarna has agreed to evaluate the patient.  We appreciate the consult.     Andrena Mews, DO 12/24/12 1742

## 2012-12-24 NOTE — Progress Notes (Signed)
ANTIBIOTIC CONSULT NOTE - INITIAL  Pharmacy Consult for Zosyn Indication: Possible Acute Cholecystitis   No Known Allergies  Patient Measurements:   Vital Signs: Temp: 97.8 F (36.6 C) (05/12 1517) Temp src: Oral (05/12 1517) BP: 136/81 mmHg (05/12 1517) Pulse Rate: 77 (05/12 1517) Intake/Output from previous day:   Intake/Output from this shift:    Labs:  Recent Labs  12/23/12 0909 12/24/12 1005  WBC 17.2* 17.2*  HGB 14.3 13.5  PLT 220 224  CREATININE 0.88 1.04   The CrCl is unknown because both a height and weight (above a minimum accepted value) are required for this calculation. No results found for this basename: VANCOTROUGH, VANCOPEAK, VANCORANDOM, GENTTROUGH, GENTPEAK, GENTRANDOM, TOBRATROUGH, TOBRAPEAK, TOBRARND, AMIKACINPEAK, AMIKACINTROU, AMIKACIN,  in the last 72 hours   Microbiology: No results found for this or any previous visit (from the past 720 hour(s)).  Medical History: History reviewed. No pertinent past medical history.  Medications:   (Not in a hospital admission) Assessment: 32 y/o M seen 5/11 for gastritis in the ED and D/C. Pt is now back today with severe abdominal pain and nausea. WBC is 17.2 today and was elevated at 17.2 from the 5/11 visit. Renal function is stable with SCr 1.04. Only remarkable finding from CT is some pericholecystic fluid. Pt is afebrile. Trichomonas present in urine and patient is getting Flagyl 2 g x 1.   Goal of Therapy:  Eradication of infection  Plan:  -Zosyn 3.375G IV q8h to be infused over 4 hours -f/u WBC, imaging, temp -f/u MD plans for anti-biotics (length of treatment, de-escalation, etc)  Abran Duke, PharmD Clinical Pharmacist Phone: [phone] Pager: [phone] 12/24/2012 3:45 PM

## 2012-12-24 NOTE — ED Notes (Signed)
Pt was seen here yesterday and diagnosed with gastritis.  Pt is back today with severe abdominal pain from chest down to belly and pt is restless.  Pt states nauseated.

## 2012-12-24 NOTE — H&P (Signed)
Roberto Goodman is an 32 y.o. male.   Chief Complaint: referred by Dr Bruce Donath for abdominal pain HPI: 32 yo healthy male with only history of some marijuana usage and no prior history of abdominal pain presents after onset of epigastric abdominal pain, vomiting, diarrhea on Saturday night after dinner. He presented to er Sunday am and was diagnosed with viral illness.  He went home continued to have another episode of emesis.  He was no longer having diarrhea. No hematemesis or brbpr.  His abdominal pain worsened and he returned to er today. When I see him he states pain is better but points to epigastrium and ruq when asked where it hurts.  Medication makes better. Palpation makes worse.  He has no more n/v/d.  Has undergone u/s and ct scan and I was called  History reviewed. No pertinent past medical history.  History reviewed. No pertinent past surgical history.  No family history on file. Social History:  reports that he has been smoking.  He does not have any smokeless tobacco history on file. He reports that  drinks alcohol. He reports that he does not use illicit drugs. Does use marijuana  Allergies: No Known Allergies  Meds none  Results for orders placed during the hospital encounter of 12/24/12 (from the past 48 hour(s))  CBC WITH DIFFERENTIAL     Status: Abnormal   Collection Time    12/24/12 10:05 AM      Result Value Range   WBC 17.2 (*) 4.0 - 10.5 K/uL   RBC 4.91  4.22 - 5.81 MIL/uL   Hemoglobin 13.5  13.0 - 17.0 g/dL   HCT 09.8 (*) 11.9 - 14.7 %   MCV 78.4  78.0 - 100.0 fL   MCH 27.5  26.0 - 34.0 pg   MCHC 35.1  30.0 - 36.0 g/dL   RDW 82.9  56.2 - 13.0 %   Platelets 224  150 - 400 K/uL   Neutrophils Relative 85 (*) 43 - 77 %   Neutro Abs 14.7 (*) 1.7 - 7.7 K/uL   Lymphocytes Relative 8 (*) 12 - 46 %   Lymphs Abs 1.3  0.7 - 4.0 K/uL   Monocytes Relative 7  3 - 12 %   Monocytes Absolute 1.2 (*) 0.1 - 1.0 K/uL   Eosinophils Relative 0  0 - 5 %   Eosinophils  Absolute 0.0  0.0 - 0.7 K/uL   Basophils Relative 0  0 - 1 %   Basophils Absolute 0.0  0.0 - 0.1 K/uL  COMPREHENSIVE METABOLIC PANEL     Status: Abnormal   Collection Time    12/24/12 10:05 AM      Result Value Range   Sodium 140  135 - 145 mEq/L   Potassium 3.4 (*) 3.5 - 5.1 mEq/L   Comment: DELTA CHECK NOTED   Chloride 105  96 - 112 mEq/L   CO2 23  19 - 32 mEq/L   Glucose, Bld 129 (*) 70 - 99 mg/dL   BUN 12  6 - 23 mg/dL   Creatinine, Ser 8.65  0.50 - 1.35 mg/dL   Calcium 9.5  8.4 - 78.4 mg/dL   Total Protein 7.0  6.0 - 8.3 g/dL   Albumin 4.1  3.5 - 5.2 g/dL   AST 28  0 - 37 U/L   ALT 20  0 - 53 U/L   Alkaline Phosphatase 38 (*) 39 - 117 U/L   Total Bilirubin 1.1  0.3 - 1.2  mg/dL   GFR calc non Af Amer >90  >90 mL/min   GFR calc Af Amer >90  >90 mL/min   Comment:            The eGFR has been calculated     using the CKD EPI equation.     This calculation has not been     validated in all clinical     situations.     eGFR's persistently     <90 mL/min signify     possible Chronic Kidney Disease.  LIPASE, BLOOD     Status: None   Collection Time    12/24/12 10:05 AM      Result Value Range   Lipase 28  11 - 59 U/L  POCT I-STAT TROPONIN I     Status: None   Collection Time    12/24/12 10:16 AM      Result Value Range   Troponin i, poc 0.00  0.00 - 0.08 ng/mL   Comment 3            Comment: Due to the release kinetics of cTnI,     a negative result within the first hours     of the onset of symptoms does not rule out     myocardial infarction with certainty.     If myocardial infarction is still suspected,     repeat the test at appropriate intervals.  URINALYSIS, ROUTINE W REFLEX MICROSCOPIC     Status: Abnormal   Collection Time    12/24/12  2:00 PM      Result Value Range   Color, Urine YELLOW  YELLOW   APPearance CLEAR  CLEAR   Specific Gravity, Urine 1.021  1.005 - 1.030   pH 7.5  5.0 - 8.0   Glucose, UA NEGATIVE  NEGATIVE mg/dL   Hgb urine dipstick  NEGATIVE  NEGATIVE   Bilirubin Urine NEGATIVE  NEGATIVE   Ketones, ur 40 (*) NEGATIVE mg/dL   Protein, ur NEGATIVE  NEGATIVE mg/dL   Urobilinogen, UA 0.2  0.0 - 1.0 mg/dL   Nitrite NEGATIVE  NEGATIVE   Leukocytes, UA SMALL (*) NEGATIVE  URINE RAPID DRUG SCREEN (HOSP PERFORMED)     Status: Abnormal   Collection Time    12/24/12  2:00 PM      Result Value Range   Opiates NONE DETECTED  NONE DETECTED   Cocaine NONE DETECTED  NONE DETECTED   Benzodiazepines NONE DETECTED  NONE DETECTED   Amphetamines NONE DETECTED  NONE DETECTED   Tetrahydrocannabinol POSITIVE (*) NONE DETECTED   Barbiturates NONE DETECTED  NONE DETECTED   Comment:            DRUG SCREEN FOR MEDICAL PURPOSES     ONLY.  IF CONFIRMATION IS NEEDED     FOR ANY PURPOSE, NOTIFY LAB     WITHIN 5 DAYS.                LOWEST DETECTABLE LIMITS     FOR URINE DRUG SCREEN     Drug Class       Cutoff (ng/mL)     Amphetamine      1000     Barbiturate      200     Benzodiazepine   200     Tricyclics       300     Opiates          300     Cocaine  300     THC              50  URINE MICROSCOPIC-ADD ON     Status: None   Collection Time    12/24/12  2:00 PM      Result Value Range   Squamous Epithelial / LPF RARE  RARE   WBC, UA 3-6  <3 WBC/hpf   RBC / HPF 0-2  <3 RBC/hpf   Bacteria, UA RARE  RARE   Urine-Other TRICHOMONAS PRESENT    OCCULT BLOOD, POC DEVICE     Status: None   Collection Time    12/24/12  4:55 PM      Result Value Range   Fecal Occult Bld NEGATIVE  NEGATIVE   US Abdomen Complete  12/24/2012  *RADIOLOGY REPORT*  Clinical Data:  Diffuse abdominal pain, right upper quadrant pain, pericholecystic fluid on preceding CT  ULTRASOUND ABDOMEN:  Technique:  Sonography of upper abdominal structures was performed.  Comparison:  CT abdomen and pelvis 12/24/2012  Gallbladder:  Small amount of pericholecystic fluid.  Gallbladder wall appears minimally thickened.  No discrete gallstones or sonographic Murphy's sign  identified.  Common bile duct:  Normal caliber 3 mm diameter appear  Liver:  Mid normal appearance.  No intrahepatic biliary dilatation.  IVC:  Normal appearance  Pancreas:  Normal appearance  Spleen:  Normal appearance, 5.8 cm length.  Right kidney:  10.7 cm length. Normal morphology without mass or hydronephrosis.  Left kidney:  10.9 cm length. Normal morphology without mass or hydronephrosis.  Aorta:  Normal caliber  Other:  Minimal pericholecystic fluid.  Small amounts of ascites were also present around the liver and in the pelvis on the preceding CT.  IMPRESSION: Mild gallbladder wall thickening, nonspecific in the setting of ascites. No discrete gallstones, sonographic Murphy's sign, or biliary dilatation identified.   Original Report Authenticated By: Ulyses Southward, M.D.    Ct Abdomen Pelvis W Contrast  12/24/2012  *RADIOLOGY REPORT*  Clinical Data: Abdominal pain, concern for appendicitis.  CT ABDOMEN AND PELVIS WITH CONTRAST  Technique:  Multidetector CT imaging of the abdomen and pelvis was performed following the standard protocol during bolus administration of intravenous contrast.  Contrast: OMNIPAQUE IOHEXOL 300 MG/ML  SOLN  Comparison: CT abdomen 08/05/2008  Findings: Lung bases are clear.  No pericardial fluid.  There is no focal hepatic lesion.  There is periportal edema likely related to recent IV therapy.  There is pericholecystic fluid which also may relate to IV fluids.  Gallbladder wall is not thickened. The gallbladder is not distended.  The pancreas, spleen, adrenal glands, kidneys are normal.  The stomach, small bowel, cecum, and appendix are normal.  The appendix fills with gas and contrast and is not inflamed.  No abnormality of the  colon or rectosigmoid colon. There the colon is poorly evaluated in part due to the lack of intra-abdominal fat.  Abdominal aorta is normal caliber.  No retroperitoneal periportal lymphadenopathy.  No free fluid the pelvis.  No distal ureteral stones or  bladder stones.  Prostate gland appears normal.  No pelvic lymphadenopathy. Review of  bone windows demonstrates no aggressive osseous lesions.  IMPRESSION:  1.  Normal appendix 2.  Pericholecystic fluid likely relates to IV therapy but cannot include exclude cholecystitis.  Recommend correlation. 3.  Periportal edema likely relates to IV hydration therapy. 4.  No clear explanation for abdominal pain and elevated white blood cell count.   Original Report Authenticated By: Genevive Bi, M.D.  Dg Abd Acute W/chest  12/24/2012  *RADIOLOGY REPORT*  Clinical Data: 33 year old male with abdominal pain and vomiting.  ACUTE ABDOMEN SERIES (ABDOMEN 2 VIEW & CHEST 1 VIEW)  Comparison: CT abdomen and pelvis 08/05/2008.  Findings: There might be mild pectus deformity explaining subtle asymmetric increased right infrahilar opacity, on the upright view of the abdomen and medial right lung base appears normal. Elsewhere the lungs are clear. Normal cardiac size and mediastinal contours.  Visualized tracheal air column is within normal limits. No pneumothorax or pneumoperitoneum.  Nonobstructed bowel gas pattern.  Abdominal and pelvic visceral contours are within normal limits.  Occasionally EKG lead button artifact. No osseous abnormality identified.  IMPRESSION: Nonobstructed bowel gas pattern, no free air. Negative chest.   Original Report Authenticated By: Erskine Speed, M.D.     Review of Systems  Constitutional: Negative for fever and chills.  Respiratory: Negative for cough and shortness of breath.   Cardiovascular: Negative for chest pain.  Gastrointestinal: Positive for nausea, vomiting, abdominal pain and diarrhea. Negative for constipation and blood in stool.  Genitourinary: Negative for dysuria and urgency.    Blood pressure 103/55, pulse 48, temperature 98.1 F (36.7 C), temperature source Oral, resp. rate 20, SpO2 100.00%. Physical Exam  Vitals reviewed. Constitutional: He appears well-developed  and well-nourished.  Eyes: No scleral icterus.  Neck: Neck supple.  Cardiovascular: Normal rate, regular rhythm, normal heart sounds and intact distal pulses.   Respiratory: Effort normal and breath sounds normal. He has no wheezes. He has no rales.  GI: Soft. Bowel sounds are normal. He exhibits no distension. There is tenderness in the right upper quadrant. No hernia.  Lymphadenopathy:    He has no cervical adenopathy.     Assessment/Plan Abdominal pain, ? Cholecystitis  Im not entirely sure he has cholecystitis although he is tender over ruq.  Has no stones but does have wall thickening and pericholecystic fluid.  Will plan on admission, recheck labs in am, has been started on abx already.  Will reevaluate in am.  I think hida tomorrow might also be reasonable plan. Discussed plan with patient.  Elyssia Strausser 12/24/2012, 9:25 PM

## 2012-12-24 NOTE — ED Notes (Signed)
Patient asked for urine sample. statws that he is unable to give sample at this time.

## 2012-12-25 ENCOUNTER — Encounter (HOSPITAL_COMMUNITY): Payer: Self-pay | Admitting: *Deleted

## 2012-12-25 ENCOUNTER — Inpatient Hospital Stay (HOSPITAL_COMMUNITY): Payer: Medicaid Other

## 2012-12-25 DIAGNOSIS — R112 Nausea with vomiting, unspecified: Secondary | ICD-10-CM

## 2012-12-25 LAB — BASIC METABOLIC PANEL
BUN: 9 mg/dL (ref 6–23)
CO2: 27 mEq/L (ref 19–32)
Calcium: 8.3 mg/dL — ABNORMAL LOW (ref 8.4–10.5)
Creatinine, Ser: 1.09 mg/dL (ref 0.50–1.35)
GFR calc non Af Amer: 89 mL/min — ABNORMAL LOW (ref >90)
Glucose, Bld: 96 mg/dL (ref 70–99)
Sodium: 140 mEq/L (ref 135–145)

## 2012-12-25 LAB — GC/CHLAMYDIA PROBE AMP
CT Probe RNA: NEGATIVE
GC Probe RNA: NEGATIVE

## 2012-12-25 LAB — CBC
MCH: 27.1 pg (ref 26.0–34.0)
MCHC: 34.2 g/dL (ref 30.0–36.0)
MCV: 79.2 fL (ref 78.0–100.0)
Platelets: 209 10*3/uL (ref 150–400)

## 2012-12-25 MED ORDER — TECHNETIUM TC 99M MEBROFENIN IV KIT
5.0000 | PACK | Freq: Once | INTRAVENOUS | Status: AC | PRN
  Administered 2012-12-25: 5 via INTRAVENOUS

## 2012-12-25 MED ORDER — SINCALIDE 5 MCG IJ SOLR
INTRAMUSCULAR | Status: AC
  Filled 2012-12-25: qty 5

## 2012-12-25 MED ORDER — SINCALIDE 5 MCG IJ SOLR
0.0400 ug/kg | Freq: Once | INTRAMUSCULAR | Status: AC
  Administered 2012-12-25: 1.28 ug via INTRAVENOUS

## 2012-12-25 NOTE — ED Provider Notes (Signed)
I have supervised the resident on the management of this patient and agree with the note above. I personally interviewed and examined the patient and my addendum is below.   Roberto Goodman is a 32 y.o. male here with ab pain. Vomiting and diffuse abdominal pain. Came here yesterday and sent home with possible gastroenteritis. Came back today because pain worsened. He said now its more periumbilical. No surgical history. Patient uncomfortable, laying in bed diaphoretic. Abdomen exam showed mild diffuse tenderness, worse in periumbilical area and epigastrium. Xray neg for free air. Labs showed WBC 17. CT showed nl appendix but free fluid around gallbladder. I was concerned about acute chole given WBC count and his presentation. His pain is minimally improved with pain meds. [place] ab ordered and showed pericholecystic fluid. Surgery consulted and patient admitted by Dr. Dwain Sarna.    Richardean Canal, MD 12/25/12 [phone]

## 2012-12-25 NOTE — Progress Notes (Signed)
Patient ID: Roberto Goodman, male   DOB: April 24, 1981, 32 y.o.   MRN: [id number]    Subjective: Pt feels about the same this morning.  Nausea a little better.  No further diarrhea.  Still having some abdominal pain.  Objective: Vital signs in last 24 hours: Temp:  [97.4 F (36.3 C)-98.9 F (37.2 C)] 98.6 F (37 C) (05/13 0647) Pulse Rate:  [48-77] 54 (05/13 0647) Resp:  [14-24] 16 (05/13 0647) BP: (103-136)/(47-107) 125/62 mmHg (05/13 0647) SpO2:  [98 %-100 %] 99 % (05/13 0647) Last BM Date: 12/22/12  Intake/Output from previous day: 05/12 0701 - 05/13 0700 In: -  Out: 300 [Urine:300] Intake/Output this shift:    PE: Abd: soft, tender in upper abdomen, but no tenderness in lower abdomen, +BS, ND Heart: regular Lungs: CTAB  Lab Results:   Recent Labs  12/23/12 0909 12/24/12 1005  WBC 17.2* 17.2*  HGB 14.3 13.5  HCT 40.0 38.5*  PLT 220 224   BMET  Recent Labs  12/23/12 0909 12/24/12 1005  NA 138 140  K 4.1 3.4*  CL 101 105  CO2 23 23  GLUCOSE 150* 129*  BUN 15 12  CREATININE 0.88 1.04  CALCIUM 10.2 9.5   PT/INR No results found for this basename: LABPROT, INR,  in the last 72 hours CMP     Component Value Date/Time   NA 140 12/24/2012 1005   K 3.4* 12/24/2012 1005   CL 105 12/24/2012 1005   CO2 23 12/24/2012 1005   GLUCOSE 129* 12/24/2012 1005   BUN 12 12/24/2012 1005   CREATININE 1.04 12/24/2012 1005   CALCIUM 9.5 12/24/2012 1005   PROT 7.0 12/24/2012 1005   ALBUMIN 4.1 12/24/2012 1005   AST 28 12/24/2012 1005   ALT 20 12/24/2012 1005   ALKPHOS 38* 12/24/2012 1005   BILITOT 1.1 12/24/2012 1005   GFRNONAA >90 12/24/2012 1005   GFRAA >90 12/24/2012 1005   Lipase     Component Value Date/Time   LIPASE 28 12/24/2012 1005       Studies/Results: US Abdomen Complete  12/24/2012  *RADIOLOGY REPORT*  Clinical Data:  Diffuse abdominal pain, right upper quadrant pain, pericholecystic fluid on preceding CT  ULTRASOUND ABDOMEN:  Technique:  Sonography of upper  abdominal structures was performed.  Comparison:  CT abdomen and pelvis 12/24/2012  Gallbladder:  Small amount of pericholecystic fluid.  Gallbladder wall appears minimally thickened.  No discrete gallstones or sonographic Murphy's sign identified.  Common bile duct:  Normal caliber 3 mm diameter appear  Liver:  Mid normal appearance.  No intrahepatic biliary dilatation.  IVC:  Normal appearance  Pancreas:  Normal appearance  Spleen:  Normal appearance, 5.8 cm length.  Right kidney:  10.7 cm length. Normal morphology without mass or hydronephrosis.  Left kidney:  10.9 cm length. Normal morphology without mass or hydronephrosis.  Aorta:  Normal caliber  Other:  Minimal pericholecystic fluid.  Small amounts of ascites were also present around the liver and in the pelvis on the preceding CT.  IMPRESSION: Mild gallbladder wall thickening, nonspecific in the setting of ascites. No discrete gallstones, sonographic Murphy's sign, or biliary dilatation identified.   Original Report Authenticated By: Ulyses Southward, M.D.    Ct Abdomen Pelvis W Contrast  12/24/2012  *RADIOLOGY REPORT*  Clinical Data: Abdominal pain, concern for appendicitis.  CT ABDOMEN AND PELVIS WITH CONTRAST  Technique:  Multidetector CT imaging of the abdomen and pelvis was performed following the standard protocol during bolus administration of intravenous contrast.  Contrast: OMNIPAQUE IOHEXOL 300 MG/ML  SOLN  Comparison: CT abdomen 08/05/2008  Findings: Lung bases are clear.  No pericardial fluid.  There is no focal hepatic lesion.  There is periportal edema likely related to recent IV therapy.  There is pericholecystic fluid which also may relate to IV fluids.  Gallbladder wall is not thickened. The gallbladder is not distended.  The pancreas, spleen, adrenal glands, kidneys are normal.  The stomach, small bowel, cecum, and appendix are normal.  The appendix fills with gas and contrast and is not inflamed.  No abnormality of the  colon or  rectosigmoid colon. There the colon is poorly evaluated in part due to the lack of intra-abdominal fat.  Abdominal aorta is normal caliber.  No retroperitoneal periportal lymphadenopathy.  No free fluid the pelvis.  No distal ureteral stones or bladder stones.  Prostate gland appears normal.  No pelvic lymphadenopathy. Review of  bone windows demonstrates no aggressive osseous lesions.  IMPRESSION:  1.  Normal appendix 2.  Pericholecystic fluid likely relates to IV therapy but cannot include exclude cholecystitis.  Recommend correlation. 3.  Periportal edema likely relates to IV hydration therapy. 4.  No clear explanation for abdominal pain and elevated white blood cell count.   Original Report Authenticated By: Genevive Bi, M.D.    Dg Abd Acute W/chest  12/24/2012  *RADIOLOGY REPORT*  Clinical Data: 32 year old male with abdominal pain and vomiting.  ACUTE ABDOMEN SERIES (ABDOMEN 2 VIEW & CHEST 1 VIEW)  Comparison: CT abdomen and pelvis 08/05/2008.  Findings: There might be mild pectus deformity explaining subtle asymmetric increased right infrahilar opacity, on the upright view of the abdomen and medial right lung base appears normal. Elsewhere the lungs are clear. Normal cardiac size and mediastinal contours.  Visualized tracheal air column is within normal limits. No pneumothorax or pneumoperitoneum.  Nonobstructed bowel gas pattern.  Abdominal and pelvic visceral contours are within normal limits.  Occasionally EKG lead button artifact. No osseous abnormality identified.  IMPRESSION: Nonobstructed bowel gas pattern, no free air. Negative chest.   Original Report Authenticated By: Erskine Speed, M.D.     Anti-infectives: Anti-infectives   Start     Dose/Rate Route Frequency Ordered Stop   12/24/12 1600  metroNIDAZOLE (FLAGYL) IVPB 2,000 mg     2,000 mg 400 mL/hr over 60 Minutes Intravenous To Emergency Dept 12/24/12 1534 12/24/12 2000   12/24/12 1600  piperacillin-tazobactam (ZOSYN) IVPB 3.375 g      3.375 g 12.5 mL/hr over 240 Minutes Intravenous 3 times per day 12/24/12 1545         Assessment/Plan  1. Abdominal pain 2. N/V/D 3. ? Cholecystitis vs gastroenteritis  Plan: 1. Will order a HIDA scan today to further determine whether these symptoms are secondary to his gb or possible a GI virus 2. Cont NPO for now and hold narcotics until HIDA can be complete.   LOS: 1 day    Kely Dohn E 12/25/2012, 9:17 AM Pager: [phone]

## 2012-12-25 NOTE — Progress Notes (Signed)
Patient seen and examined.  Awaiting HIDA scan.

## 2012-12-26 ENCOUNTER — Encounter: Payer: Self-pay | Admitting: General Surgery

## 2012-12-26 MED ORDER — PANTOPRAZOLE SODIUM 40 MG PO TBEC: 40.0000 mg | DELAYED_RELEASE_TABLET | Freq: Every day | ORAL | Status: DC

## 2012-12-26 NOTE — Progress Notes (Signed)
Agree Roberto Burlison, MD, MPH, FACS Pager: [phone]  

## 2012-12-26 NOTE — Discharge Summary (Signed)
Patient ID: Roberto Goodman MRN: [id number] DOB/AGE: Jan 22, 1981 31 y.o.  Admit date: 12/24/2012 Discharge date: 12/26/2012  Procedures: none  Consults: None  Reason for Admission: 32 yo healthy male with only history of some marijuana usage and no prior history of abdominal pain presents after onset of epigastric abdominal pain, vomiting, diarrhea on Saturday night after dinner. He presented to er Sunday am and was diagnosed with viral illness. He went home continued to have another episode of emesis. He was no longer having diarrhea. No hematemesis or brbpr. His abdominal pain worsened and he returned to er today. When I see him he states pain is better but points to epigastrium and ruq when asked where it hurts. Medication makes better. Palpation makes worse. He has no more n/v/d. Has undergone u/s and ct scan and I was called  Admission Diagnoses:  1. Abdominal pain  Hospital Course: The patient was admitted for abdominal pain.  Because it wasn't clear whether he had cholecystitis or not, we ordered a HIDA scan, which was negative.  The patient was feeling better the following day and his diet was advanced as tolerated.  He was stable for dc home.  Discharge Diagnoses:  1. gastroenteritis  Discharge Medications:   Medication List    TAKE these medications       ondansetron 8 MG tablet  Commonly known as:  ZOFRAN  Take 1 tablet (8 mg total) by mouth every 8 (eight) hours as needed for nausea.        Discharge Instructions:     Follow-up Information   Please follow up. (follow up with a primary doctor is symptoms persist or worsen)       Follow up with [facility]. (As needed)    Contact information:   [address] [place] [address] [phone]      Signed: Letha Cape 12/26/2012, 3:12 PM

## 2012-12-26 NOTE — Progress Notes (Addendum)
Discharge instructions go over with patient. Home medications gone over with patient. Informations given on my chart and viral gastroenteritis. Follow up with primary doctor is symptoms persist or worsen. Follow up with [facility] as needed. Patient verbalized understanding of instructions.

## 2012-12-26 NOTE — Progress Notes (Signed)
Subjective: Pain is much better.  Having some diarrhea.  Tolerating clear liquids.  Objective: Vital signs in last 24 hours: Temp:  [97.3 F (36.3 C)-99.7 F (37.6 C)] 97.3 F (36.3 C) (05/14 0612) Pulse Rate:  [51-53] 53 (05/14 0612) Resp:  [16-20] 16 (05/14 0612) BP: (102-134)/(50-74) 121/65 mmHg (05/14 0612) SpO2:  [100 %] 100 % (05/14 0612) Last BM Date: 12/25/12  Intake/Output from previous day: 05/13 0701 - 05/14 0700 In: 4111.5 [P.O.:360; I.V.:3501.5; IV Piggyback:250] Out: 3203 [Urine:3200; Stool:3] Intake/Output this shift:    PE: General- In NAD Abdomen-soft, mild tenderness in RUQ and epigastrium  Lab Results:   Recent Labs  12/24/12 1005 12/25/12 0930  WBC 17.2* 10.3  HGB 13.5 12.0*  HCT 38.5* 35.1*  PLT 224 209   BMET  Recent Labs  12/24/12 1005 12/25/12 0930  NA 140 140  K 3.4* 3.5  CL 105 106  CO2 23 27  GLUCOSE 129* 96  BUN 12 9  CREATININE 1.04 1.09  CALCIUM 9.5 8.3*   PT/INR No results found for this basename: LABPROT, INR,  in the last 72 hours Comprehensive Metabolic Panel:    Component Value Date/Time   NA 140 12/25/2012 0930   K 3.5 12/25/2012 0930   CL 106 12/25/2012 0930   CO2 27 12/25/2012 0930   BUN 9 12/25/2012 0930   CREATININE 1.09 12/25/2012 0930   GLUCOSE 96 12/25/2012 0930   CALCIUM 8.3* 12/25/2012 0930   AST 28 12/24/2012 1005   ALT 20 12/24/2012 1005   ALKPHOS 38* 12/24/2012 1005   BILITOT 1.1 12/24/2012 1005   PROT 7.0 12/24/2012 1005   ALBUMIN 4.1 12/24/2012 1005     Studies/Results: US Abdomen Complete  12/24/2012   *RADIOLOGY REPORT*  Clinical Data:  Diffuse abdominal pain, right upper quadrant pain, pericholecystic fluid on preceding CT  ULTRASOUND ABDOMEN:  Technique:  Sonography of upper abdominal structures was performed.  Comparison:  CT abdomen and pelvis 12/24/2012  Gallbladder:  Small amount of pericholecystic fluid.  Gallbladder wall appears minimally thickened.  No discrete gallstones or sonographic  Murphy's sign identified.  Common bile duct:  Normal caliber 3 mm diameter appear  Liver:  Mid normal appearance.  No intrahepatic biliary dilatation.  IVC:  Normal appearance  Pancreas:  Normal appearance  Spleen:  Normal appearance, 5.8 cm length.  Right kidney:  10.7 cm length. Normal morphology without mass or hydronephrosis.  Left kidney:  10.9 cm length. Normal morphology without mass or hydronephrosis.  Aorta:  Normal caliber  Other:  Minimal pericholecystic fluid.  Small amounts of ascites were also present around the liver and in the pelvis on the preceding CT.  IMPRESSION: Mild gallbladder wall thickening, nonspecific in the setting of ascites. No discrete gallstones, sonographic Murphy's sign, or biliary dilatation identified.   Original Report Authenticated By: Ulyses Southward, M.D.   Ct Abdomen Pelvis W Contrast  12/24/2012   *RADIOLOGY REPORT*  Clinical Data: Abdominal pain, concern for appendicitis.  CT ABDOMEN AND PELVIS WITH CONTRAST  Technique:  Multidetector CT imaging of the abdomen and pelvis was performed following the standard protocol during bolus administration of intravenous contrast.  Contrast: OMNIPAQUE IOHEXOL 300 MG/ML  SOLN  Comparison: CT abdomen 08/05/2008  Findings: Lung bases are clear.  No pericardial fluid.  There is no focal hepatic lesion.  There is periportal edema likely related to recent IV therapy.  There is pericholecystic fluid which also may relate to IV fluids.  Gallbladder wall is not thickened. The gallbladder  is not distended.  The pancreas, spleen, adrenal glands, kidneys are normal.  The stomach, small bowel, cecum, and appendix are normal.  The appendix fills with gas and contrast and is not inflamed.  No abnormality of the  colon or rectosigmoid colon. There the colon is poorly evaluated in part due to the lack of intra-abdominal fat.  Abdominal aorta is normal caliber.  No retroperitoneal periportal lymphadenopathy.  No free fluid the pelvis.  No distal  ureteral stones or bladder stones.  Prostate gland appears normal.  No pelvic lymphadenopathy. Review of  bone windows demonstrates no aggressive osseous lesions.  IMPRESSION:  1.  Normal appendix 2.  Pericholecystic fluid likely relates to IV therapy but cannot include exclude cholecystitis.  Recommend correlation. 3.  Periportal edema likely relates to IV hydration therapy. 4.  No clear explanation for abdominal pain and elevated white blood cell count.   Original Report Authenticated By: Genevive Bi, M.D.   Nm Hepato W/eject Fract  12/25/2012   *RADIOLOGY REPORT*  Clinical Data:  Right upper quadrant pain  NUCLEAR MEDICINE HEPATOBILIARY IMAGING WITH GALLBLADDER EF  Technique:  Sequential images of the abdomen were obtained out to 60 minutes following intravenous administration of radiopharmaceutical.  After slow intravenous infusion of 1.29 micrograms Cholecystokinin, gallbladder ejection fraction was determined.  Radiopharmaceutical:  5.0 mCi Tc-73m Choletec  Comparison:  None.  Findings: Gallbladder activity occurs after 15 minutes.  Small bowel activity occurs after 15 minutes.  Gallbladder ejection fraction is 86%.  The patient did experience abdominal pain symptoms during CCK infusion.  IMPRESSION: Cystic and common bile ducts are patent.  Gallbladder ejection fraction is within normal limits.   Original Report Authenticated By: Jolaine Click, M.D.   Dg Abd Acute W/chest  12/24/2012   *RADIOLOGY REPORT*  Clinical Data: 32 year old male with abdominal pain and vomiting.  ACUTE ABDOMEN SERIES (ABDOMEN 2 VIEW & CHEST 1 VIEW)  Comparison: CT abdomen and pelvis 08/05/2008.  Findings: There might be mild pectus deformity explaining subtle asymmetric increased right infrahilar opacity, on the upright view of the abdomen and medial right lung base appears normal. Elsewhere the lungs are clear. Normal cardiac size and mediastinal contours.  Visualized tracheal air column is within normal limits. No  pneumothorax or pneumoperitoneum.  Nonobstructed bowel gas pattern.  Abdominal and pelvic visceral contours are within normal limits.  Occasionally EKG lead button artifact. No osseous abnormality identified.  IMPRESSION: Nonobstructed bowel gas pattern, no free air. Negative chest.   Original Report Authenticated By: Erskine Speed, M.D.    Anti-infectives: Anti-infectives   Start     Dose/Rate Route Frequency Ordered Stop   12/24/12 1600  metroNIDAZOLE (FLAGYL) IVPB 2,000 mg     2,000 mg 400 mL/hr over 60 Minutes Intravenous To Emergency Dept 12/24/12 1534 12/24/12 2000   12/24/12 1600  piperacillin-tazobactam (ZOSYN) IVPB 3.375 g     3.375 g 12.5 mL/hr over 240 Minutes Intravenous 3 times per day 12/24/12 1545        Assessment Abdominal pain-HIDA normal; etiology is most likely gastroenteritis    LOS: 2 days   Plan: Advance to full liquids.  May be able to go home later today on a restricted diet.   Marcelene Weidemann J 12/26/2012

## 2012-12-26 NOTE — Progress Notes (Signed)
He has tolerated a regular diet and a full liquid diet, he feels fine and is ready to go home.

## 2012-12-27 LAB — URINE CULTURE: Colony Count: >=100000

## 2012-12-27 NOTE — Discharge Summary (Signed)
Agree with summary. 

## 2012-12-28 ENCOUNTER — Telehealth (INDEPENDENT_AMBULATORY_CARE_PROVIDER_SITE_OTHER): Payer: Self-pay | Admitting: General Surgery

## 2012-12-28 ENCOUNTER — Other Ambulatory Visit (INDEPENDENT_AMBULATORY_CARE_PROVIDER_SITE_OTHER): Payer: Self-pay | Admitting: General Surgery

## 2012-12-28 DIAGNOSIS — N390 Urinary tract infection, site not specified: Secondary | ICD-10-CM | POA: Insufficient documentation

## 2012-12-28 MED ORDER — CIPROFLOXACIN HCL 500 MG PO TABS: 500.0000 mg | ORAL_TABLET | Freq: Two times a day (BID) | ORAL | Status: AC

## 2012-12-28 NOTE — Telephone Encounter (Signed)
I have contacted this patient to inform him that he has a UTI.  He is aware that Cipro 500mg  1 po BID should be taken for 5 days.  Roberto Goodman E

## 2014-05-27 ENCOUNTER — Encounter (HOSPITAL_COMMUNITY): Payer: Self-pay | Admitting: Emergency Medicine

## 2014-05-27 ENCOUNTER — Emergency Department (INDEPENDENT_AMBULATORY_CARE_PROVIDER_SITE_OTHER)
Admission: EM | Admit: 2014-05-27 | Discharge: 2014-05-27 | Disposition: A | Discharge status: Home / Self Care | Payer: Managed Care, Other (non HMO) | Source: Home / Self Care

## 2014-05-27 DIAGNOSIS — M898X1 Other specified disorders of bone, shoulder: Secondary | ICD-10-CM

## 2014-05-27 MED ORDER — DICLOFENAC SODIUM 50 MG PO TBEC: 50.0000 mg | DELAYED_RELEASE_TABLET | Freq: Two times a day (BID) | ORAL | Status: DC | PRN

## 2014-05-27 MED ORDER — CYCLOBENZAPRINE HCL 10 MG PO TABS: 10.0000 mg | ORAL_TABLET | Freq: Every evening | ORAL | Status: DC | PRN

## 2014-05-27 NOTE — ED Provider Notes (Signed)
Roberto Goodman is a 33 y.o. male who presents to Urgent Care today for left shoulder pain. Patient is a three-day history of pain in her and the left shoulder. The pain occurred spontaneously without injury. Pain is moderate to severe and worse with arm motion. No radiating pain weakness or numbness. No medications tried yet.   No past medical history on file. History  Substance Use Topics  . Smoking status: Current Every Day Smoker  . Smokeless tobacco: Not on file  . Alcohol Use: Yes   ROS as above Medications: No current facility-administered medications for this encounter.   Current Outpatient Prescriptions  Medication Sig Dispense Refill  . cyclobenzaprine (FLEXERIL) 10 MG tablet Take 1 tablet (10 mg total) by mouth at bedtime as needed for muscle spasms.  20 tablet  0  . diclofenac (VOLTAREN) 50 MG EC tablet Take 1 tablet (50 mg total) by mouth 2 (two) times daily as needed.  30 tablet  0    Exam:  BP 107/67  Pulse 88  Temp(Src) 97.9 F (36.6 C) (Oral)  Resp 16  SpO2 100% Gen: Well NAD HEENT: EOMI,  MMM Lungs: Normal work of breathing. CTABL Heart: RRR no MRG Abd: NABS, Soft. Nondistended, Nontender Exts: Brisk capillary refill, warm and well perfused.  Shoulder: Normal-appearing. Tender palpation left rhomboid and trapezius. Normal external and internal rotation. Pain with abduction located in the periscapular area. No scapular winging present..  Negative impingement testing.  Strength is intact Pulses capillary refill sensation intact bilateral upper extremities.   Limited musculoskeletal ultrasound of the left shoulder:  Normal-appearing biceps tendon is intact and in the groove.  Subscapularis is normal-appearing Supraspinatus is normal-appearing Infraspinatus is normal-appearing A.c. joint is also normal appearing Impression: Normal left shoulder limited musculoskeletal ultrasound   No results found for this or any previous visit (from the past 24  hour(s)). No results found.  Assessment and Plan: 33 y.o. male with left shoulder periscapular myofascial pain. Plan for diclofenac Flexeril refer to physical therapy and followup with orthopedics if not improved.  Discussed warning signs or symptoms. Please see discharge instructions. Patient expresses understanding.     Rodolph BongEvan S Micaiah Litle, MD 05/27/14 2006

## 2014-05-27 NOTE — ED Notes (Signed)
Left shoulder pain, history of shoulder issues

## 2014-05-27 NOTE — Discharge Instructions (Signed)
Thank you for coming in today. Come back or go to the emergency room if you notice new weakness new numbness problems walking or bowel or bladder problems.  Attend physical therapy.  Use a heating pad.  Take Flexeril at bedtime as needed for muscle spell.  Use diclofenac during the day for pain as needed Followup with Dr. Farris HasKramer if not getting better.

## 2014-07-24 ENCOUNTER — Other Ambulatory Visit: Payer: Self-pay | Admitting: Neurological Surgery

## 2014-07-24 DIAGNOSIS — M5412 Radiculopathy, cervical region: Secondary | ICD-10-CM

## 2014-07-29 ENCOUNTER — Ambulatory Visit
Admission: RE | Admit: 2014-07-29 | Discharge: 2014-07-29 | Disposition: A | Discharge status: Home / Self Care | Payer: Managed Care, Other (non HMO) | Source: Ambulatory Visit | Attending: Neurological Surgery | Admitting: Neurological Surgery

## 2014-07-29 DIAGNOSIS — M5412 Radiculopathy, cervical region: Secondary | ICD-10-CM

## 2014-07-29 MED ORDER — DIAZEPAM 5 MG PO TABS
10.0000 mg | ORAL_TABLET | Freq: Once | ORAL | Status: AC
  Administered 2014-07-29: 10 mg via ORAL

## 2014-07-29 MED ORDER — IOHEXOL 300 MG/ML  SOLN
10.0000 mL | Freq: Once | INTRAMUSCULAR | Status: AC | PRN
  Administered 2014-07-29: 10 mL via INTRATHECAL

## 2014-07-29 MED ORDER — MEPERIDINE HCL 100 MG/ML IJ SOLN
75.0000 mg | Freq: Once | INTRAMUSCULAR | Status: AC
  Administered 2014-07-29: 75 mg via INTRAMUSCULAR

## 2014-07-29 MED ORDER — ONDANSETRON HCL 4 MG/2ML IJ SOLN
4.0000 mg | Freq: Once | INTRAMUSCULAR | Status: AC
  Administered 2014-07-29: 4 mg via INTRAMUSCULAR

## 2014-07-29 NOTE — Discharge Instructions (Signed)

## 2014-08-01 ENCOUNTER — Telehealth: Payer: Self-pay | Admitting: Radiology

## 2014-08-01 ENCOUNTER — Telehealth: Payer: Self-pay

## 2014-08-01 NOTE — Telephone Encounter (Signed)
Pt had myelo on Tuesday and has had a headache since Wednesday. Pt is maintaining bedrest and will continue to force fluids. Pt will wait until Monday to see if he is still having a headache. Dr. Yetta BarreJones' office called for blood patch order.

## 2014-08-04 ENCOUNTER — Telehealth: Payer: Self-pay | Admitting: Radiology

## 2014-08-04 NOTE — Telephone Encounter (Signed)
Post myelo. Pt doing better after weekend of bedrest, has mild discomfort if he coughs or bends over. He will continue bedrest and fluid per his preference.

## 2014-11-27 ENCOUNTER — Emergency Department (HOSPITAL_COMMUNITY): Payer: Managed Care, Other (non HMO)

## 2014-11-27 ENCOUNTER — Encounter (HOSPITAL_COMMUNITY): Payer: Self-pay | Admitting: *Deleted

## 2014-11-27 ENCOUNTER — Emergency Department (HOSPITAL_COMMUNITY)
Admission: EM | Admit: 2014-11-27 | Discharge: 2014-11-27 | Disposition: A | Discharge status: Home / Self Care | Payer: Managed Care, Other (non HMO) | Attending: Emergency Medicine | Admitting: Emergency Medicine

## 2014-11-27 DIAGNOSIS — Z981 Arthrodesis status: Secondary | ICD-10-CM | POA: Insufficient documentation

## 2014-11-27 DIAGNOSIS — S39012A Strain of muscle, fascia and tendon of lower back, initial encounter: Secondary | ICD-10-CM | POA: Insufficient documentation

## 2014-11-27 DIAGNOSIS — Z720 Tobacco use: Secondary | ICD-10-CM | POA: Diagnosis not present

## 2014-11-27 DIAGNOSIS — Y92410 Unspecified street and highway as the place of occurrence of the external cause: Secondary | ICD-10-CM | POA: Diagnosis not present

## 2014-11-27 DIAGNOSIS — Y9389 Activity, other specified: Secondary | ICD-10-CM | POA: Insufficient documentation

## 2014-11-27 DIAGNOSIS — Y998 Other external cause status: Secondary | ICD-10-CM | POA: Insufficient documentation

## 2014-11-27 DIAGNOSIS — S161XXA Strain of muscle, fascia and tendon at neck level, initial encounter: Secondary | ICD-10-CM | POA: Diagnosis not present

## 2014-11-27 DIAGNOSIS — S199XXA Unspecified injury of neck, initial encounter: Secondary | ICD-10-CM | POA: Diagnosis present

## 2014-11-27 MED ORDER — HYDROCODONE-ACETAMINOPHEN 5-325 MG PO TABS: 1.0000 | ORAL_TABLET | Freq: Four times a day (QID) | ORAL | Status: DC | PRN

## 2014-11-27 MED ORDER — CYCLOBENZAPRINE HCL 10 MG PO TABS: 10.0000 mg | ORAL_TABLET | Freq: Three times a day (TID) | ORAL | Status: DC | PRN

## 2014-11-27 MED ORDER — OXYCODONE-ACETAMINOPHEN 5-325 MG PO TABS
1.0000 | ORAL_TABLET | Freq: Once | ORAL | Status: AC
  Administered 2014-11-27: 1 via ORAL
  Filled 2014-11-27: qty 1
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
## 2014-11-27 NOTE — Progress Notes (Signed)
EDCM spoke to patient at bedside.  Patient confirms he [place] without a pcp.  EDCM provided patient with list of pcps who accept [place] insurance within a twenty mile radius of patient's zip code.  EDCM discussed importance and purpose of having a pcp.  EDCM also discussed use of ED for emergency situations and use of urgent care clinics.  Patient thankful for resources.  No further EDCM needs at this time.

## 2014-11-27 NOTE — ED Notes (Signed)
Bed: [id number] Expected date:  Expected time:  Means of arrival:  Comments: Hold for a minute
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
## 2014-11-27 NOTE — Discharge Instructions (Signed)
Return here as needed. Follow up with your doctor. Your x-rays were normal tonight

## 2016-01-08 ENCOUNTER — Other Ambulatory Visit (HOSPITAL_COMMUNITY): Payer: Self-pay | Admitting: Neurological Surgery

## 2016-01-08 DIAGNOSIS — M5412 Radiculopathy, cervical region: Secondary | ICD-10-CM

## 2016-01-27 ENCOUNTER — Ambulatory Visit (HOSPITAL_COMMUNITY)
Admission: RE | Admit: 2016-01-27 | Discharge: 2016-01-27 | Disposition: A | Discharge status: Home / Self Care | Payer: Managed Care, Other (non HMO) | Source: Ambulatory Visit | Attending: Neurological Surgery | Admitting: Neurological Surgery

## 2016-01-27 DIAGNOSIS — M5412 Radiculopathy, cervical region: Secondary | ICD-10-CM | POA: Diagnosis not present

## 2016-01-27 DIAGNOSIS — M50221 Other cervical disc displacement at C4-C5 level: Secondary | ICD-10-CM | POA: Diagnosis not present

## 2016-01-27 DIAGNOSIS — M50222 Other cervical disc displacement at C5-C6 level: Secondary | ICD-10-CM | POA: Diagnosis not present

## 2016-01-27 DIAGNOSIS — M4802 Spinal stenosis, cervical region: Secondary | ICD-10-CM | POA: Insufficient documentation

## 2016-03-21 ENCOUNTER — Encounter (HOSPITAL_COMMUNITY): Payer: Self-pay | Admitting: Vascular Surgery

## 2016-03-21 ENCOUNTER — Emergency Department (HOSPITAL_COMMUNITY): Payer: Managed Care, Other (non HMO)

## 2016-03-21 ENCOUNTER — Observation Stay (HOSPITAL_COMMUNITY)
Admission: EM | Admit: 2016-03-21 | Discharge: 2016-03-24 | Disposition: A | Discharge status: Home Health | Payer: Managed Care, Other (non HMO) | Attending: Neurological Surgery | Admitting: Neurological Surgery

## 2016-03-21 DIAGNOSIS — E876 Hypokalemia: Secondary | ICD-10-CM | POA: Diagnosis not present

## 2016-03-21 DIAGNOSIS — A419 Sepsis, unspecified organism: Secondary | ICD-10-CM

## 2016-03-21 DIAGNOSIS — M542 Cervicalgia: Secondary | ICD-10-CM

## 2016-03-21 DIAGNOSIS — E872 Acidosis, unspecified: Secondary | ICD-10-CM | POA: Diagnosis present

## 2016-03-21 DIAGNOSIS — F17200 Nicotine dependence, unspecified, uncomplicated: Secondary | ICD-10-CM | POA: Diagnosis not present

## 2016-03-21 DIAGNOSIS — R509 Fever, unspecified: Secondary | ICD-10-CM | POA: Diagnosis not present

## 2016-03-21 DIAGNOSIS — R109 Unspecified abdominal pain: Secondary | ICD-10-CM | POA: Insufficient documentation

## 2016-03-21 DIAGNOSIS — Z981 Arthrodesis status: Secondary | ICD-10-CM | POA: Diagnosis not present

## 2016-03-21 DIAGNOSIS — R1013 Epigastric pain: Secondary | ICD-10-CM

## 2016-03-21 DIAGNOSIS — R6510 Systemic inflammatory response syndrome (SIRS) of non-infectious origin without acute organ dysfunction: Principal | ICD-10-CM | POA: Diagnosis present

## 2016-03-21 DIAGNOSIS — T814XXA Infection following a procedure, initial encounter: Secondary | ICD-10-CM | POA: Diagnosis present

## 2016-03-21 DIAGNOSIS — R1084 Generalized abdominal pain: Secondary | ICD-10-CM | POA: Diagnosis present

## 2016-03-21 LAB — COMPREHENSIVE METABOLIC PANEL
ALT: 21 U/L (ref 17–63)
AST: 26 U/L (ref 15–41)
Albumin: 4.2 g/dL (ref 3.5–5.0)
Alkaline Phosphatase: 49 U/L (ref 38–126)
Anion gap: 15 (ref 5–15)
BUN: 12 mg/dL (ref 6–20)
CHLORIDE: 103 mmol/L (ref 101–111)
CO2: 21 mmol/L — AB (ref 22–32)
CREATININE: 1.04 mg/dL (ref 0.61–1.24)
Calcium: 10.1 mg/dL (ref 8.9–10.3)
GFR calc Af Amer: >60 mL/min (ref >60)
GFR calc non Af Amer: >60 mL/min (ref >60)
Glucose, Bld: 150 mg/dL — ABNORMAL HIGH (ref 65–99)
POTASSIUM: 2.8 mmol/L — AB (ref 3.5–5.1)
SODIUM: 139 mmol/L (ref 135–145)
Total Bilirubin: 0.9 mg/dL (ref 0.3–1.2)
Total Protein: 7.1 g/dL (ref 6.5–8.1)

## 2016-03-21 LAB — I-STAT CG4 LACTIC ACID, ED
LACTIC ACID, VENOUS: 6.12 mmol/L — AB (ref 0.5–1.9)
LACTIC ACID, VENOUS: 4.98 mmol/L — AB (ref 0.5–1.9)
Lactic Acid, Venous: 4.08 mmol/L (ref 0.5–1.9)

## 2016-03-21 LAB — URINALYSIS, ROUTINE W REFLEX MICROSCOPIC
Bilirubin Urine: NEGATIVE
GLUCOSE, UA: NEGATIVE mg/dL
Hgb urine dipstick: NEGATIVE
Ketones, ur: 15 mg/dL — AB
LEUKOCYTES UA: NEGATIVE
NITRITE: NEGATIVE
PH: 8 (ref 5.0–8.0)
Protein, ur: NEGATIVE mg/dL
SPECIFIC GRAVITY, URINE: 1.031 — AB (ref 1.005–1.030)

## 2016-03-21 LAB — CBC WITH DIFFERENTIAL/PLATELET
BASOS ABS: 0 10*3/uL (ref 0.0–0.1)
BASOS PCT: 0 %
Basophils Absolute: 0 10*3/uL (ref 0.0–0.1)
Basophils Relative: 0 %
EOS PCT: 0 %
Eosinophils Absolute: 0 10*3/uL (ref 0.0–0.7)
Eosinophils Absolute: 0 10*3/uL (ref 0.0–0.7)
Eosinophils Relative: 0 %
HCT: 41.5 % (ref 39.0–52.0)
HEMATOCRIT: 42.3 % (ref 39.0–52.0)
HEMOGLOBIN: 14.7 g/dL (ref 13.0–17.0)
Hemoglobin: 14.1 g/dL (ref 13.0–17.0)
LYMPHS PCT: 8 %
LYMPHS PCT: 5 %
Lymphs Abs: 1.4 10*3/uL (ref 0.7–4.0)
Lymphs Abs: 0.9 10*3/uL (ref 0.7–4.0)
MCH: 28 pg (ref 26.0–34.0)
MCH: 27.3 pg (ref 26.0–34.0)
MCHC: 34.8 g/dL (ref 30.0–36.0)
MCHC: 34 g/dL (ref 30.0–36.0)
MCV: 80.6 fL (ref 78.0–100.0)
MCV: 80.4 fL (ref 78.0–100.0)
MONO ABS: 1.2 10*3/uL — AB (ref 0.1–1.0)
Monocytes Absolute: 0.7 10*3/uL (ref 0.1–1.0)
Monocytes Relative: 7 %
Monocytes Relative: 4 %
NEUTROS ABS: 14.6 10*3/uL — AB (ref 1.7–7.7)
NEUTROS PCT: 85 %
NEUTROS PCT: 91 %
Neutro Abs: 14.8 10*3/uL — ABNORMAL HIGH (ref 1.7–7.7)
PLATELETS: 269 10*3/uL (ref 150–400)
Platelets: 270 10*3/uL (ref 150–400)
RBC: 5.25 MIL/uL (ref 4.22–5.81)
RBC: 5.16 MIL/uL (ref 4.22–5.81)
RDW: 13.4 % (ref 11.5–15.5)
RDW: 13.3 % (ref 11.5–15.5)
WBC: 17.3 10*3/uL — ABNORMAL HIGH (ref 4.0–10.5)
WBC: 16.4 10*3/uL — AB (ref 4.0–10.5)

## 2016-03-21 LAB — SEDIMENTATION RATE: Sed Rate: 5 mm/hr (ref 0–16)

## 2016-03-21 LAB — C-REACTIVE PROTEIN: CRP: 0.6 mg/dL (ref <1.0)

## 2016-03-21 MED ORDER — OXYCODONE-ACETAMINOPHEN 5-325 MG PO TABS
1.0000 | ORAL_TABLET | ORAL | Status: DC | PRN
  Administered 2016-03-21 – 2016-03-24 (×7): 2 via ORAL
  Filled 2016-03-21 (×7): qty 2

## 2016-03-21 MED ORDER — ACETAMINOPHEN 650 MG RE SUPP: 650.0000 mg | RECTAL | Status: DC | PRN

## 2016-03-21 MED ORDER — SODIUM CHLORIDE 0.9% FLUSH: 3.0000 mL | INTRAVENOUS | Status: DC | PRN

## 2016-03-21 MED ORDER — FENTANYL CITRATE (PF) 100 MCG/2ML IJ SOLN
50.0000 ug | Freq: Once | INTRAMUSCULAR | Status: AC
  Administered 2016-03-21: 50 ug via INTRAVENOUS
  Filled 2016-03-21: qty 2

## 2016-03-21 MED ORDER — SODIUM CHLORIDE 0.9 % IV BOLUS (SEPSIS)
1000.0000 mL | Freq: Once | INTRAVENOUS | Status: AC
  Administered 2016-03-21: 1000 mL via INTRAVENOUS

## 2016-03-21 MED ORDER — FAMOTIDINE IN NACL 20-0.9 MG/50ML-% IV SOLN
20.0000 mg | Freq: Two times a day (BID) | INTRAVENOUS | Status: DC
  Administered 2016-03-22 (×2): 20 mg via INTRAVENOUS
  Filled 2016-03-21 (×2): qty 50

## 2016-03-21 MED ORDER — GI COCKTAIL ~~LOC~~
30.0000 mL | Freq: Once | ORAL | Status: AC
  Administered 2016-03-21: 30 mL via ORAL
  Filled 2016-03-21: qty 30

## 2016-03-21 MED ORDER — VANCOMYCIN HCL IN DEXTROSE 1-5 GM/200ML-% IV SOLN
1000.0000 mg | Freq: Once | INTRAVENOUS | Status: AC
  Administered 2016-03-21: 1000 mg via INTRAVENOUS
  Filled 2016-03-21: qty 200

## 2016-03-21 MED ORDER — PHENOL 1.4 % MT LIQD: 1.0000 | OROMUCOSAL | Status: DC | PRN

## 2016-03-21 MED ORDER — ONDANSETRON HCL 4 MG/2ML IJ SOLN
4.0000 mg | INTRAMUSCULAR | Status: DC | PRN
  Administered 2016-03-22: 4 mg via INTRAVENOUS
  Filled 2016-03-21: qty 2

## 2016-03-21 MED ORDER — ONDANSETRON HCL 4 MG/2ML IJ SOLN
4.0000 mg | Freq: Once | INTRAMUSCULAR | Status: AC
  Administered 2016-03-21: 4 mg via INTRAVENOUS
  Filled 2016-03-21: qty 2

## 2016-03-21 MED ORDER — IOPAMIDOL (ISOVUE-300) INJECTION 61%
INTRAVENOUS | Status: AC
  Administered 2016-03-21: 100 mL
  Filled 2016-03-21: qty 100

## 2016-03-21 MED ORDER — PIPERACILLIN-TAZOBACTAM 3.375 G IVPB
3.3750 g | Freq: Three times a day (TID) | INTRAVENOUS | Status: DC
  Administered 2016-03-22 – 2016-03-24 (×7): 3.375 g via INTRAVENOUS
  Filled 2016-03-21 (×10): qty 50

## 2016-03-21 MED ORDER — GADOBENATE DIMEGLUMINE 529 MG/ML IV SOLN
15.0000 mL | Freq: Once | INTRAVENOUS | Status: AC
  Administered 2016-03-21: 13 mL via INTRAVENOUS

## 2016-03-21 MED ORDER — KCL IN DEXTROSE-NACL 20-5-0.45 MEQ/L-%-% IV SOLN
INTRAVENOUS | Status: DC
  Administered 2016-03-21 – 2016-03-23 (×2): via INTRAVENOUS
  Filled 2016-03-21 (×2): qty 1000

## 2016-03-21 MED ORDER — ACETAMINOPHEN 325 MG PO TABS: 650.0000 mg | ORAL_TABLET | ORAL | Status: DC | PRN

## 2016-03-21 MED ORDER — HYDROMORPHONE HCL 1 MG/ML IJ SOLN
0.5000 mg | INTRAMUSCULAR | Status: DC | PRN
  Administered 2016-03-22 – 2016-03-23 (×7): 1 mg via INTRAVENOUS
  Filled 2016-03-21 (×7): qty 1

## 2016-03-21 MED ORDER — SODIUM CHLORIDE 0.9 % IV SOLN: 250.0000 mL | INTRAVENOUS | Status: DC

## 2016-03-21 MED ORDER — IOPAMIDOL (ISOVUE-300) INJECTION 61%: 100.0000 mL | Freq: Once | INTRAVENOUS | Status: DC | PRN

## 2016-03-21 MED ORDER — VANCOMYCIN HCL IN DEXTROSE 1-5 GM/200ML-% IV SOLN
1000.0000 mg | Freq: Three times a day (TID) | INTRAVENOUS | Status: DC
Start: 2016-03-21 — End: 2016-03-23
  Administered 2016-03-22 – 2016-03-23 (×5): 1000 mg via INTRAVENOUS
  Filled 2016-03-21 (×6): qty 200

## 2016-03-21 MED ORDER — SODIUM CHLORIDE 0.9 % IV SOLN: INTRAVENOUS | Status: DC

## 2016-03-21 MED ORDER — MORPHINE SULFATE (PF) 4 MG/ML IV SOLN
4.0000 mg | Freq: Once | INTRAVENOUS | Status: AC
  Administered 2016-03-21: 4 mg via INTRAVENOUS
  Filled 2016-03-21: qty 1

## 2016-03-21 MED ORDER — MENTHOL 3 MG MT LOZG: 1.0000 | LOZENGE | OROMUCOSAL | Status: DC | PRN

## 2016-03-21 MED ORDER — CEFAZOLIN SODIUM-DEXTROSE 2-4 GM/100ML-% IV SOLN
2.0000 g | Freq: Three times a day (TID) | INTRAVENOUS | Status: DC
  Administered 2016-03-22: 2 g via INTRAVENOUS
  Filled 2016-03-21 (×2): qty 100

## 2016-03-21 MED ORDER — SODIUM CHLORIDE 0.9% FLUSH
3.0000 mL | Freq: Two times a day (BID) | INTRAVENOUS | Status: DC
  Administered 2016-03-22 (×2): 3 mL via INTRAVENOUS

## 2016-03-21 MED ORDER — CYCLOBENZAPRINE HCL 10 MG PO TABS
10.0000 mg | ORAL_TABLET | Freq: Three times a day (TID) | ORAL | Status: DC | PRN
  Administered 2016-03-22 – 2016-03-24 (×5): 10 mg via ORAL
  Filled 2016-03-21 (×5): qty 1

## 2016-03-21 MED ORDER — POTASSIUM CHLORIDE 10 MEQ/100ML IV SOLN
10.0000 meq | INTRAVENOUS | Status: AC
  Administered 2016-03-21 (×3): 10 meq via INTRAVENOUS
  Filled 2016-03-21 (×3): qty 100

## 2016-03-21 MED ORDER — PIPERACILLIN-TAZOBACTAM 3.375 G IVPB 30 MIN
3.3750 g | Freq: Once | INTRAVENOUS | Status: AC
  Administered 2016-03-21: 3.375 g via INTRAVENOUS
  Filled 2016-03-21: qty 50

## 2016-03-21 NOTE — ED Provider Notes (Signed)
[facility]-EMERGENCY DEPT Provider Note   CSN: [id number] Arrival date & time: 03/21/16  1226   History   Chief Complaint Chief Complaint  Patient presents with  . Wound Infection    HPI Roberto Goodman is a 35 y.o. male.  HPI  History reviewed. No pertinent past medical history.  Patient presents with concern of abdominal pain, neck pain, nausea, vomiting, generalized discomfort. Symptoms began within the past day, initially with abdominal discomfort, and subsequent with neck pain. Then the patient again to have generalized weakness, nausea, and since that time has had multiple episodes of vomiting as well. No dyspnea, no urinary complaints, no chest pain. The abdominal pain is sore, diffuse. The neck pain is posterior, nonradiating, sore. No medication taken for relief as he is nauseous. Patient has a notable history of posterior approach cervical spine fusion 2 weeks ago. Patient states that he is generally well, was recovering well following that procedure.   Patient Active Problem List   Diagnosis Date Noted  . UTI (urinary tract infection) 12/28/2012    Past Surgical History:  Procedure Laterality Date  . BACK SURGERY    . CERVICAL FUSION         Home Medications    Prior to Admission medications   Medication Sig Start Date End Date Taking? Authorizing Provider  cyclobenzaprine (FLEXERIL) 10 MG tablet Take 1 tablet (10 mg total) by mouth 3 (three) times daily as needed for muscle spasms. 11/27/14  Yes Charlestine Night, PA-C  Oxycodone HCl 10 MG TABS Take 10 mg by mouth 3 (three) times daily as needed (for pain).  03/11/16  Yes Historical Provider, MD  diclofenac (VOLTAREN) 50 MG EC tablet Take 1 tablet (50 mg total) by mouth 2 (two) times daily as needed. Patient not taking: Reported on 11/27/2014 05/27/14   Rodolph Bong, MD  HYDROcodone-acetaminophen (NORCO/VICODIN) 5-325 MG per tablet Take 1 tablet by mouth every 6 (six) hours as needed for moderate  pain. Patient not taking: Reported on 03/21/2016 11/27/14   Charlestine Night, PA-C  ibuprofen (ADVIL,MOTRIN) 200 MG tablet Take 200 mg by mouth every 6 (six) hours as needed for headache or mild pain.     Historical Provider, MD    Family History No family history on file.  Social History Social History  Substance Use Topics  . Smoking status: Current Every Day Smoker  . Smokeless tobacco: Never Used  . Alcohol use Yes     Allergies   Review of patient's allergies indicates no known allergies.   Review of Systems Review of Systems  Constitutional: Positive for chills.  HENT:       Per HPI, otherwise negative  Eyes: Negative for visual disturbance.  Respiratory:       Per HPI, otherwise negative  Cardiovascular:       Per HPI, otherwise negative  Gastrointestinal: Positive for abdominal pain, nausea and vomiting.  Endocrine:       Negative aside from HPI  Genitourinary:       Neg aside from HPI   Musculoskeletal:       Per HPI, otherwise negative  Skin: Negative.   Neurological: Positive for weakness. Negative for syncope.     Physical Exam Updated Vital Signs BP 160/97   Pulse 75   Temp (!) 96.9 F (36.1 C) (Rectal)   Resp 15   SpO2 100%   Physical Exam  Constitutional: He is oriented to person, place, and time.  Uncomfortable appearing thin young male awake and alert  HENT:  Head: Normocephalic and atraumatic.  Eyes: Conjunctivae and EOM are normal.  Neck:    Cardiovascular: Normal rate and regular rhythm.   Pulmonary/Chest: Effort normal. No stridor. No respiratory distress.  Abdominal: He exhibits no distension.    Musculoskeletal: He exhibits no edema.  Neurological: He is alert and oriented to person, place, and time.  Neurologic exam unremarkable aside from minimal diminished grip strength on the left, patient and wife states this is normal.  No facial asymmetry, moves all extremity spontaneously, oriented appropriately  Skin: Skin is warm  and dry.  Psychiatric: He has a normal mood and affect.  Nursing note and vitals reviewed.    ED Treatments / Results  Labs (all labs ordered are listed, but only abnormal results are displayed) Labs Reviewed  COMPREHENSIVE METABOLIC PANEL - Abnormal; Notable for the following:       Result Value   Potassium 2.8 (*)    CO2 21 (*)    Glucose, Bld 150 (*)    All other components within normal limits  CBC WITH DIFFERENTIAL/PLATELET - Abnormal; Notable for the following:    WBC 17.3 (*)    Neutro Abs 14.6 (*)    Monocytes Absolute 1.2 (*)    All other components within normal limits  URINALYSIS, ROUTINE W REFLEX MICROSCOPIC (NOT [facility]) - Abnormal; Notable for the following:    Specific Gravity, Urine 1.031 (*)    Ketones, ur 15 (*)    All other components within normal limits  CBC WITH DIFFERENTIAL/PLATELET - Abnormal; Notable for the following:    WBC 16.4 (*)    Neutro Abs 14.8 (*)    All other components within normal limits  I-STAT CG4 LACTIC ACID, ED - Abnormal; Notable for the following:    Lactic Acid, Venous 6.12 (*)    All other components within normal limits  I-STAT CG4 LACTIC ACID, ED - Abnormal; Notable for the following:    Lactic Acid, Venous 4.08 (*)    All other components within normal limits  URINE CULTURE  CULTURE, BLOOD (ROUTINE X 2)  CULTURE, BLOOD (ROUTINE X 2)  I-STAT CG4 LACTIC ACID, ED    EKG  EKG Interpretation  Date/Time:  Monday March 21 2016 14:35:58 EDT Ventricular Rate:  89 PR Interval:    QRS Duration: 93 QT Interval:  403 QTC Calculation: 491 R Axis:   56 Text Interpretation:  Sinus rhythm RSR' in V1 or V2, probably normal variant Prolonged QT interval Abnormal ekg Confirmed by Gerhard MunchLOCKWOOD, Darnella Zeiter  MD [phone]) on 03/21/2016 3:28:59 PM       Radiology Ct Abdomen Pelvis W Contrast  Result Date: 03/21/2016 CLINICAL DATA:  Abdominal pain, nausea and vomiting beginning last night. EXAM: CT ABDOMEN AND PELVIS WITH CONTRAST TECHNIQUE:  Multidetector CT imaging of the abdomen and pelvis was performed using the standard protocol following bolus administration of intravenous contrast. CONTRAST:  100 ml ISOVUE-300 IOPAMIDOL (ISOVUE-300) INJECTION 61% COMPARISON:  CT abdomen and pelvis 08/05/2008. FINDINGS: The lung bases are clear.  No pleural or pericardial effusion. The gallbladder, the liver, spleen, adrenal glands, right kidney and pancreas appear normal. The appears to be a small nonobstructing stone in the upper pole left kidney. Small left renal cyst is noted. There is no lymphadenopathy or fluid. The stomach, small and large bowel and appendix appear normal. The urinary bladder is unremarkable. No focal bony abnormality is seen. IMPRESSION: No acute abnormality abdomen or pelvis. Likely nonobstructing stone upper pole left kidney. Electronically Signed  By: Drusilla Kanner M.D.   On: 03/21/2016 14:42   Dg Chest Port 1 View  Result Date: 03/21/2016 CLINICAL DATA:  Sepsis, chest pain EXAM: PORTABLE CHEST 1 VIEW COMPARISON:  12/24/2012 FINDINGS: Cardiomediastinal silhouette is stable. No acute infiltrate or pleural effusion. No pulmonary edema. Bony thorax is unremarkable. IMPRESSION: No active disease. Electronically Signed   By: Natasha Mead M.D.   On: 03/21/2016 13:58    Procedures Procedures (including critical care time)  Medications Ordered in ED Medications  vancomycin (VANCOCIN) IVPB 1000 mg/200 mL premix (1,000 mg Intravenous New Bag/Given 03/21/16 1506)  piperacillin-tazobactam (ZOSYN) IVPB 3.375 g (not administered)  vancomycin (VANCOCIN) IVPB 1000 mg/200 mL premix (not administered)  iopamidol (ISOVUE-300) 61 % injection 100 mL (not administered)  gi cocktail (Maalox,Lidocaine,Donnatal) (not administered)  morphine 4 MG/ML injection 4 mg (not administered)  ondansetron (ZOFRAN) injection 4 mg (not administered)  sodium chloride 0.9 % bolus 1,000 mL (1,000 mLs Intravenous New Bag/Given 03/21/16 1409)    And  sodium  chloride 0.9 % bolus 1,000 mL (1,000 mLs Intravenous New Bag/Given 03/21/16 1434)  piperacillin-tazobactam (ZOSYN) IVPB 3.375 g (0 g Intravenous Stopped 03/21/16 1522)  iopamidol (ISOVUE-300) 61 % injection (100 mLs  Contrast Given 03/21/16 1418)  fentaNYL (SUBLIMAZE) injection 50 mcg (50 mcg Intravenous Given 03/21/16 1503)     Initial Impression / Assessment and Plan / ED Course  I have reviewed the triage vital signs and the nursing notes.  Pertinent labs & imaging results that were available during my care of the patient were reviewed by me and considered in my medical decision making (see chart for details).  Clinical Course    After the initial evaluation, with concern for sepsis given the patient's hypothermia, abdominal pain, patient received empiric fluid antibiotics, antiemetics, analgesia.   On initial repeat exam the patient appears slightly better.  Update: Lactic acid now 4, down from initial 6.   3:52 PM Initial results discussed with the patient and his wife. Patient continues to complain of abdominal discomfort, nausea, neck pain unchanged.  I discussed the patient's case with Dr. Yetta Barre, his neurosurgeon.  MRI pending and will be reviewed  Final Clinical Impressions(s) / ED Diagnoses   Final diagnoses:  Sepsis (HCC)   Young male with recent cervical spine fixation now presents with generalized discomfort, abdominal, and neck pain. Given the patient's initial hypothermia, lactic acidosis, patient received empiric sepsis resuscitation measures, including fluids, broad-spectrum antibiotics. Initial infectious evaluation notable for reassuring abdominal CT scan, unremarkable chest x-ray, normal urinalysis. Though the patient has capacity to move his neck, given his recent surgery, there is some suspicion for contributory etiology. MRI pending, neurosurgery aware, dispo pending with admission anticipated.  CRITICAL CARE Performed by: Gerhard Munch Total critical care  time: 45 minutes Critical care time was exclusive of separately billable procedures and treating other patients. Critical care was necessary to treat or prevent imminent or life-threatening deterioration. Critical care was time spent personally by me on the following activities: development of treatment plan with patient and/or surrogate as well as nursing, discussions with consultants, evaluation of patient's response to treatment, examination of patient, obtaining history from patient or surrogate, ordering and performing treatments and interventions, ordering and review of laboratory studies, ordering and review of radiographic studies, pulse oximetry and re-evaluation of patient's condition.      Gerhard Munch, MD 03/21/16 [phone]

## 2016-03-21 NOTE — Progress Notes (Signed)
Pharmacy Antibiotic Note Roberto Goodman is a 35 y.o. male admitted on 8/7/2017with concern for sepsis in setting of recent posterior cervical fusion.  Pharmacy has been consulted for Zosyn and vancomycin dosing.  Plan: 1. Vancomycin 1000 IV every 8 hours.  Goal trough 15-20 mcg/mL. 2. Zosyn 3.375g IV q8h (4 hour infusion).     Temp (24hrs), Avg:96.9 F (36.1 C), Min:96.9 F (36.1 C), Max:96.9 F (36.1 C)   Recent Labs Lab 03/21/16 1313 03/21/16 1321  WBC 17.3*  --   CREATININE 1.04  --   LATICACIDVEN  --  6.12*    CrCl cannot be calculated (Unknown ideal weight.).    No Known Allergies  Antimicrobials this admission: 8/7 Zosyn >>  8/7 vancomycin  >>   Dose adjustments this admission: N/a  Microbiology results: 8/7 BCx: px  Thank you for allowing pharmacy to be a part of this patient's care.  Pollyann SamplesAndy Severn Goodman, PharmD, BCPS 03/21/2016, 2:17 PM Pager: [phone]

## 2016-03-21 NOTE — Consult Note (Signed)
Medical Consultation   Roberto Goodman  WUJ:[id number]  DOB: 26-Oct-1980  DOA: 03/21/2016  PCP: No PCP Per Patient  Outpatient Specialists: Dr. Yetta Barre is neurosurgeon   Requesting physician: Dr. Wynetta Emery  Reason for consultation: Abdominal pain, SIRS   History of Present Illness: Roberto Goodman is an 35 y.o. male who just underwent posterior cervical spinal fusion 2 weeks ago.  He presents to the ED with c/o severe abdominal pain.  On presentation to ED lactate was > 6.  He had leukocytosis and hypothermia.  Sepsis protocol initiated.  30ml /kg IVF given, as well as zosyn / vanc.  Temperature normalized and lactate came down to 4.0 before trending up to 4.9 at last check.  CT scan abd/pelvis WITH contrast was performed and was essentially negative.  MRI of neck was performed and a fluid collection was identified.  Radiology is concerned about abscess, neurosurgery thinks that this is more likely just usual post-operative changes and that the wound is not infected.  Medicine is consulted.  He states that his neck actually dosent feel that bad at the moment.  His bigger complaint is abdominal pain and tenderness.  This is worst in the epigastric area, no radiation.  Review of Systems:  ROS As per HPI otherwise 10 point review of systems negative.     Past Medical History: History reviewed. No pertinent past medical history.  Past Surgical History: Past Surgical History:  Procedure Laterality Date  . BACK SURGERY    . CERVICAL FUSION       Allergies:  No Known Allergies   Social History:  reports that he has been smoking.  He has never used smokeless tobacco. He reports that he drinks alcohol. He reports that he does not use drugs.   Family History: No family history on file. Family history reviewed and not pertinent.  No recent GI illness in family.     Physical Exam: Vitals:   03/21/16 2045 03/21/16 2100 03/21/16 2130 03/21/16 2200  BP:  126/76 131/82 142/93 (!) 144/71  Pulse: 81 90 86 68  Resp: Temp:    98.2 F (36.8 C)  TempSrc:    Oral  SpO2: 100% 100% 100% 100%    Constitutional: Alert and awake, oriented x3, not in any acute distress. Eyes: PERLA, EOMI, irises appear normal, anicteric sclera,  ENMT: external ears and nose appear normal            Lips appears normal, oropharynx mucosa, tongue, posterior pharynx appear normal  Neck: neck appears normal, no masses, normal ROM, no thyromegaly, no JVD, incision midline in posterior neck, no erythema, no discharge, or other obvious stigmata of infection. CVS: S1-S2 clear, no murmur rubs or gallops, no LE edema, normal pedal pulses  Respiratory:  clear to auscultation bilaterally, no wheezing, rales or rhonchi. Respiratory effort normal. No accessory muscle use.  Abdomen: soft epigastric tenderness, nondistended, normal bowel sounds, no hepatosplenomegaly, no hernias  Musculoskeletal: : no cyanosis, clubbing or edema noted bilaterally                   Neuro: Cranial nerves II-XII intact, strength, sensation, reflexes Psych: judgement and insight appear normal, stable mood and affect, mental status Skin: no rashes or lesions or ulcers, no induration or nodules    Data reviewed:  I have personally reviewed following labs and imaging studies Labs:  CBC:  Recent  Labs Lab 03/21/16 1313 03/21/16 1358  WBC 17.3* 16.4*  NEUTROABS 14.6* 14.8*  HGB 14.7 14.1  HCT 42.3 41.5  MCV 80.6 80.4  PLT 270 269    Basic Metabolic Panel:  Recent Labs Lab 03/21/16 1313  NA 139  K 2.8*  CL 103  CO2 21*  GLUCOSE 150*  BUN 12  CREATININE 1.04  CALCIUM 10.1   GFR CrCl cannot be calculated (Unknown ideal weight.). Liver Function Tests:  Recent Labs Lab 03/21/16 1313  AST 26  ALT 21  ALKPHOS 49  BILITOT 0.9  PROT 7.1  ALBUMIN 4.2   No results for input(s): LIPASE, AMYLASE in the last 168 hours. No results for input(s): AMMONIA in the last 168  hours. Coagulation profile No results for input(s): INR, PROTIME in the last 168 hours.  Cardiac Enzymes: No results for input(s): CKTOTAL, CKMB, CKMBINDEX, TROPONINI in the last 168 hours. BNP: Invalid input(s): POCBNP CBG: No results for input(s): GLUCAP in the last 168 hours. D-Dimer No results for input(s): DDIMER in the last 72 hours. Hgb A1c No results for input(s): HGBA1C in the last 72 hours. Lipid Profile No results for input(s): CHOL, HDL, LDLCALC, TRIG, CHOLHDL, LDLDIRECT in the last 72 hours. Thyroid function studies No results for input(s): TSH, T4TOTAL, T3FREE, THYROIDAB in the last 72 hours.  Invalid input(s): FREET3 Anemia work up No results for input(s): VITAMINB12, FOLATE, FERRITIN, TIBC, IRON, RETICCTPCT in the last 72 hours. Urinalysis    Component Value Date/Time   COLORURINE YELLOW 03/21/2016 1501   APPEARANCEUR CLEAR 03/21/2016 1501   LABSPEC 1.031 (H) 03/21/2016 1501   PHURINE 8.0 03/21/2016 1501   GLUCOSEU NEGATIVE 03/21/2016 1501   HGBUR NEGATIVE 03/21/2016 1501   BILIRUBINUR NEGATIVE 03/21/2016 1501   KETONESUR 15 (A) 03/21/2016 1501   PROTEINUR NEGATIVE 03/21/2016 1501   UROBILINOGEN 0.2 12/24/2012 1400   NITRITE NEGATIVE 03/21/2016 1501   LEUKOCYTESUR NEGATIVE 03/21/2016 1501     Microbiology No results found for this or any previous visit (from the past 240 hour(s)).     Inpatient Medications:   Scheduled Meds: .  ceFAZolin (ANCEF) IV  2 g Intravenous Q8H  . famotidine (PEPCID) IV  20 mg Intravenous Q12H  . piperacillin-tazobactam (ZOSYN)  IV  3.375 g Intravenous Q8H  . sodium chloride flush  3 mL Intravenous Q12H  . vancomycin  1,000 mg Intravenous Q8H   Continuous Infusions: . sodium chloride    . sodium chloride    . dextrose 5 % and 0.45 % NaCl with KCl 20 mEq/L       Radiological Exams on Admission: Mr Cervical Spine W Wo Contrast  Result Date: 03/21/2016 CLINICAL DATA:  Sepsis. Drainage from surgical site. Posterior  cervical fusion 03/09/2016 EXAM: MRI CERVICAL SPINE WITHOUT AND WITH CONTRAST TECHNIQUE: Multiplanar and multiecho pulse sequences of the cervical spine, to include the craniocervical junction and cervicothoracic junction, were obtained according to standard protocol without and with intravenous contrast. CONTRAST:  13mL MULTIHANCE GADOBENATE DIMEGLUMINE 529 MG/ML IV SOLN COMPARISON:  MRI 01/27/2016 FINDINGS: ACDF at C6-7 was present on the prior MRI. Interval posterior hardware fusion C4 through C7. Normal alignment. Straightening of the cervical lordosis. Negative for fracture or mass lesion. Spinal cord signal normal. Rim enhancing fluid collection is present in the surgical wound in the midline extending down to the lamina of C5 and C6 bilaterally. Fluid extends to the skin surface and there is a 15 mm fluid pocket in the subcutaneous tissues. There is irregular peripheral enhancement.  Given the history of purulent drainage in recent surgery, this is most likely a wound infection. C2-3:  Negative C3-4:  Mild disc degeneration without stenosis C4-5:  Mild disc degeneration without stenosis C5-6: Disc degeneration and spondylosis. Mild spinal stenosis. Posterior decompression since the prior study C6-7:  ACDF without stenosis C7-T1: Negative IMPRESSION: Recent posterior hardware fusion C4 through C7. There a is fluid collection along the surgical wound track with peripheral enhancement suggestive of wound infection. 15 mm fluid collection in the subcutaneous tissues in the midline which appears be draining to the skin surface. No evidence of discitis or osteomyelitis in the cervical spine. Electronically Signed   By: Marlan Palau M.D.   On: 03/21/2016 20:33   Ct Abdomen Pelvis W Contrast  Result Date: 03/21/2016 CLINICAL DATA:  Abdominal pain, nausea and vomiting beginning last night. EXAM: CT ABDOMEN AND PELVIS WITH CONTRAST TECHNIQUE: Multidetector CT imaging of the abdomen and pelvis was performed using  the standard protocol following bolus administration of intravenous contrast. CONTRAST:  100 ml ISOVUE-300 IOPAMIDOL (ISOVUE-300) INJECTION 61% COMPARISON:  CT abdomen and pelvis 08/05/2008. FINDINGS: The lung bases are clear.  No pleural or pericardial effusion. The gallbladder, the liver, spleen, adrenal glands, right kidney and pancreas appear normal. The appears to be a small nonobstructing stone in the upper pole left kidney. Small left renal cyst is noted. There is no lymphadenopathy or fluid. The stomach, small and large bowel and appendix appear normal. The urinary bladder is unremarkable. No focal bony abnormality is seen. IMPRESSION: No acute abnormality abdomen or pelvis. Likely nonobstructing stone upper pole left kidney. Electronically Signed   By: Drusilla Kanner M.D.   On: 03/21/2016 14:42   Dg Chest Port 1 View  Result Date: 03/21/2016 CLINICAL DATA:  Sepsis, chest pain EXAM: PORTABLE CHEST 1 VIEW COMPARISON:  12/24/2012 FINDINGS: Cardiomediastinal silhouette is stable. No acute infiltrate or pleural effusion. No pulmonary edema. Bony thorax is unremarkable. IMPRESSION: No active disease. Electronically Signed   By: Natasha Mead M.D.   On: 03/21/2016 13:58    Impression/Recommendations Active Problems:   Fever   Abdominal pain   SIRS (systemic inflammatory response syndrome) (HCC)   Lactic acidosis  SIRS, abdominal pain, fever -  Despite the high lactate, ischemia seems very unlikely in this 35 yo M with no findings at all on CT WITH contrast.  Checking lipase, although his SIRS is quite severe for a pancreatitis that isnt even showing up on CT scan.  Repeat CMP in AM  Trend leukocytosis with repeat CBC in AM  Leaving patient on zosyn / vanc for now  UDS pending  Lactic acidosis -  Serial lactates ordered  Continue IVF  Hypokalemia - replaced in ED, repeat CMP in AM.   Thank you for this consultation.  Our [facility] hospitalist team will follow the patient with you.   Time  Spent: 70 min  GARDNER, JARED M. D.O. [facility] 03/21/2016, 11:12 PM

## 2016-03-21 NOTE — ED Notes (Signed)
Patient still off the unit for MRI 

## 2016-03-21 NOTE — ED Provider Notes (Signed)
I received this patient in signout from Dr. Jeraldine LootsLockwood. We were awaiting MRI results to discuss with neurosurgery.  Repeat sepsis assessment completed myself. The patient was resting comfortably, stated he was hungry but denied any other complaints.  VS: BP 160/102 RR 22 HR 80 O2 100% on RA Temp 97.7 orally  CV exam: RRR, no m/r/g Normal WOB, CTA b/l  Skin: Color: warm and dry, no pallor Cap refill <3 sec Peripheral pulses 2+ and equal b/l  MRI shows fluid collection with surrounding enhancement at site of surgical incision. I discussed with neurosurgeon, Dr. Wynetta Emeryram, who felt that these findings could be expected changes post-surgery. However, the patient's infectious workup including UA, CXR, CT abd/pelvis have been negative for source therefore NSGY will admit. Per his request, I discussed the case with internal medicine, Dr. Julian ReilGardner, who will consult on the patient for assistance in workup. Patient admitted for further care.   Laurence Spatesachel Morgan Little, MD 03/22/16 [phone]

## 2016-03-21 NOTE — ED Triage Notes (Signed)
Pt reports to the ED for eval of surgical site bleeding and purulent drainage. Pt had posterior cervical fusion 2 weeks ago. Pt has also been having some N/V. Pt pale and diaphoretic. Complaining of some severe abd pain as well. UTA temp due to severe pain and inability to sit still.

## 2016-03-21 NOTE — ED Notes (Signed)
Pt alert and oriented x 4. Potassium IV currently running, potassium delayed due to MRI. Admitting dr at the bedside. NSR. Wife at the bedside.   Hobartallie, RN [phone]

## 2016-03-21 NOTE — H&P (Signed)
Roberto Goodman is an 35 y.o. male.   Chief Complaint: Abdominal pain HPI: 35 year old woman who is 2 weeks status post posterior cervical fusion. Dr. Ronnald Ramp has come the ER with some occasional history of drainage from his neck incision hypothermic complaining of severe abdominal pain. Upon admission to the ER was noted to have a lactic acid of over 6 was placed in the septic protocol underwent CT scan of his abdomen pelvis as well as MRI scan of his neck CT scan of his abdomen was negative. MRI of his neck showed small postoperative fluid collection unclear whether this represents infection or just normal postoperative fluid collection. Patient was noted to be hypothermic with a measured temperature 90.6 he is currently now normothermic did have an elevated white count of 17.  History reviewed. No pertinent past medical history.  Past Surgical History:  Procedure Laterality Date  . BACK SURGERY    . CERVICAL FUSION      No family history on file. Social History:  reports that he has been smoking.  He has never used smokeless tobacco. He reports that he drinks alcohol. He reports that he does not use drugs.  Allergies: No Known Allergies   (Not in a hospital admission)  Results for orders placed or performed during the hospital encounter of 03/21/16 (from the past 48 hour(s))  Comprehensive metabolic panel     Status: Abnormal   Collection Time: 03/21/16  1:13 PM  Result Value Ref Range   Sodium 139 135 - 145 mmol/L   Potassium 2.8 (L) 3.5 - 5.1 mmol/L   Chloride 103 101 - 111 mmol/L   CO2 21 (L) 22 - 32 mmol/L   Glucose, Bld 150 (H) 65 - 99 mg/dL   BUN 12 6 - 20 mg/dL   Creatinine, Ser 1.04 0.61 - 1.24 mg/dL   Calcium 10.1 8.9 - 10.3 mg/dL   Total Protein 7.1 6.5 - 8.1 g/dL   Albumin 4.2 3.5 - 5.0 g/dL   AST 26 15 - 41 U/L   ALT 21 17 - 63 U/L   Alkaline Phosphatase 49 38 - 126 U/L   Total Bilirubin 0.9 0.3 - 1.2 mg/dL   GFR calc non Af Amer >60 >60 mL/min   GFR calc Af  Amer >60 >60 mL/min    Comment: (NOTE) The eGFR has been calculated using the CKD EPI equation. This calculation has not been validated in all clinical situations. eGFR's persistently <60 mL/min signify possible Chronic Kidney Disease.    Anion gap 15 5 - 15  CBC with Differential     Status: Abnormal   Collection Time: 03/21/16  1:13 PM  Result Value Ref Range   WBC 17.3 (H) 4.0 - 10.5 K/uL   RBC 5.25 4.22 - 5.81 MIL/uL   Hemoglobin 14.7 13.0 - 17.0 g/dL   HCT 42.3 39.0 - 52.0 %   MCV 80.6 78.0 - 100.0 fL   MCH 28.0 26.0 - 34.0 pg   MCHC 34.8 30.0 - 36.0 g/dL   RDW 13.4 11.5 - 15.5 %   Platelets 270 150 - 400 K/uL   Neutrophils Relative % 85 %   Neutro Abs 14.6 (H) 1.7 - 7.7 K/uL   Lymphocytes Relative 8 %   Lymphs Abs 1.4 0.7 - 4.0 K/uL   Monocytes Relative 7 %   Monocytes Absolute 1.2 (H) 0.1 - 1.0 K/uL   Eosinophils Relative 0 %   Eosinophils Absolute 0.0 0.0 - 0.7 K/uL   Basophils Relative  0 %   Basophils Absolute 0.0 0.0 - 0.1 K/uL  I-Stat CG4 Lactic Acid, ED     Status: Abnormal   Collection Time: 03/21/16  1:21 PM  Result Value Ref Range   Lactic Acid, Venous 6.12 (HH) 0.5 - 1.9 mmol/L   Comment NOTIFIED PHYSICIAN   CBC WITH DIFFERENTIAL     Status: Abnormal   Collection Time: 03/21/16  1:58 PM  Result Value Ref Range   WBC 16.4 (H) 4.0 - 10.5 K/uL   RBC 5.16 4.22 - 5.81 MIL/uL   Hemoglobin 14.1 13.0 - 17.0 g/dL   HCT 41.5 39.0 - 52.0 %   MCV 80.4 78.0 - 100.0 fL   MCH 27.3 26.0 - 34.0 pg   MCHC 34.0 30.0 - 36.0 g/dL   RDW 13.3 11.5 - 15.5 %   Platelets 269 150 - 400 K/uL   Neutrophils Relative % 91 %   Neutro Abs 14.8 (H) 1.7 - 7.7 K/uL   Lymphocytes Relative 5 %   Lymphs Abs 0.9 0.7 - 4.0 K/uL   Monocytes Relative 4 %   Monocytes Absolute 0.7 0.1 - 1.0 K/uL   Eosinophils Relative 0 %   Eosinophils Absolute 0.0 0.0 - 0.7 K/uL   Basophils Relative 0 %   Basophils Absolute 0.0 0.0 - 0.1 K/uL  I-Stat CG4 Lactic Acid, ED  (not at  [facility])     Status:  Abnormal   Collection Time: 03/21/16  2:08 PM  Result Value Ref Range   Lactic Acid, Venous 4.08 (HH) 0.5 - 1.9 mmol/L   Comment NOTIFIED PHYSICIAN   Urinalysis, Routine w reflex microscopic     Status: Abnormal   Collection Time: 03/21/16  3:01 PM  Result Value Ref Range   Color, Urine YELLOW YELLOW   APPearance CLEAR CLEAR   Specific Gravity, Urine 1.031 (H) 1.005 - 1.030   pH 8.0 5.0 - 8.0   Glucose, UA NEGATIVE NEGATIVE mg/dL   Hgb urine dipstick NEGATIVE NEGATIVE   Bilirubin Urine NEGATIVE NEGATIVE   Ketones, ur 15 (A) NEGATIVE mg/dL   Protein, ur NEGATIVE NEGATIVE mg/dL   Nitrite NEGATIVE NEGATIVE   Leukocytes, UA NEGATIVE NEGATIVE    Comment: MICROSCOPIC NOT DONE ON URINES WITH NEGATIVE PROTEIN, BLOOD, LEUKOCYTES, NITRITE, OR GLUCOSE <1000 mg/dL.  I-Stat CG4 Lactic Acid, ED     Status: Abnormal   Collection Time: 03/21/16  4:16 PM  Result Value Ref Range   Lactic Acid, Venous 4.98 (HH) 0.5 - 1.9 mmol/L   Comment NOTIFIED PHYSICIAN    Mr Cervical Spine W Wo Contrast  Result Date: 03/21/2016 CLINICAL DATA:  Sepsis. Drainage from surgical site. Posterior cervical fusion 03/09/2016 EXAM: MRI CERVICAL SPINE WITHOUT AND WITH CONTRAST TECHNIQUE: Multiplanar and multiecho pulse sequences of the cervical spine, to include the craniocervical junction and cervicothoracic junction, were obtained according to standard protocol without and with intravenous contrast. CONTRAST:  50m MULTIHANCE GADOBENATE DIMEGLUMINE 529 MG/ML IV SOLN COMPARISON:  MRI 01/27/2016 FINDINGS: ACDF at C6-7 was present on the prior MRI. Interval posterior hardware fusion C4 through C7. Normal alignment. Straightening of the cervical lordosis. Negative for fracture or mass lesion. Spinal cord signal normal. Rim enhancing fluid collection is present in the surgical wound in the midline extending down to the lamina of C5 and C6 bilaterally. Fluid extends to the skin surface and there is a 15 mm fluid pocket in the  subcutaneous tissues. There is irregular peripheral enhancement. Given the history of purulent drainage in recent surgery, this  is most likely a wound infection. C2-3:  Negative C3-4:  Mild disc degeneration without stenosis C4-5:  Mild disc degeneration without stenosis C5-6: Disc degeneration and spondylosis. Mild spinal stenosis. Posterior decompression since the prior study C6-7:  ACDF without stenosis C7-T1: Negative IMPRESSION: Recent posterior hardware fusion C4 through C7. There a is fluid collection along the surgical wound track with peripheral enhancement suggestive of wound infection. 15 mm fluid collection in the subcutaneous tissues in the midline which appears be draining to the skin surface. No evidence of discitis or osteomyelitis in the cervical spine. Electronically Signed   By: Franchot Gallo M.D.   On: 03/21/2016 20:33   Ct Abdomen Pelvis W Contrast  Result Date: 03/21/2016 CLINICAL DATA:  Abdominal pain, nausea and vomiting beginning last night. EXAM: CT ABDOMEN AND PELVIS WITH CONTRAST TECHNIQUE: Multidetector CT imaging of the abdomen and pelvis was performed using the standard protocol following bolus administration of intravenous contrast. CONTRAST:  100 ml ISOVUE-300 IOPAMIDOL (ISOVUE-300) INJECTION 61% COMPARISON:  CT abdomen and pelvis 08/05/2008. FINDINGS: The lung bases are clear.  No pleural or pericardial effusion. The gallbladder, the liver, spleen, adrenal glands, right kidney and pancreas appear normal. The appears to be a small nonobstructing stone in the upper pole left kidney. Small left renal cyst is noted. There is no lymphadenopathy or fluid. The stomach, small and large bowel and appendix appear normal. The urinary bladder is unremarkable. No focal bony abnormality is seen. IMPRESSION: No acute abnormality abdomen or pelvis. Likely nonobstructing stone upper pole left kidney. Electronically Signed   By: Inge Rise M.D.   On: 03/21/2016 14:42   Dg Chest Port 1  View  Result Date: 03/21/2016 CLINICAL DATA:  Sepsis, chest pain EXAM: PORTABLE CHEST 1 VIEW COMPARISON:  12/24/2012 FINDINGS: Cardiomediastinal silhouette is stable. No acute infiltrate or pleural effusion. No pulmonary edema. Bony thorax is unremarkable. IMPRESSION: No active disease. Electronically Signed   By: Lahoma Crocker M.D.   On: 03/21/2016 13:58    Review of Systems  Constitutional: Positive for diaphoresis and fever.  Gastrointestinal: Positive for abdominal pain, nausea and vomiting.  Musculoskeletal: Positive for myalgias.    Blood pressure 131/82, pulse 90, temperature 98.4 F (36.9 C), temperature source Rectal, resp. rate 12, SpO2 100 %. Physical Exam  Constitutional: He is oriented to person, place, and time. He appears well-developed and well-nourished.  HENT:  Head: Normocephalic.  Eyes: Pupils are equal, round, and reactive to light.  Neck: Normal range of motion.  Respiratory: Effort normal.  GI: Soft.  Neurological: He is alert and oriented to person, place, and time. He has normal strength. GCS eye subscore is 4. GCS verbal subscore is 5. GCS motor subscore is 6.  Patient is awake alert strength out of 5 upper and lower 70s incision with a mild amount of redness no overt drainage.     Assessment/Plan 35 year old presented with hypothermic temperature elevated white count and abdominal pain workup so far has been negative MRI scan of his neck shows a postoperative fluid collection could very well be normal unclear whether this represents an infection I see no active drainage from his incision right now. However he does have an elevated white count we have a C-reactive protein and sedimentation rate pending wouldn't get medicine consult to help work up the abdominal pain and I'll be admitting him place him on Ancef after blood cultures are obtained and will put him in the hospital under observation.  Sathvika Ojo P, MD 03/21/2016, 9:44 PM

## 2016-03-21 NOTE — ED Notes (Signed)
EDP at bedside  

## 2016-03-21 NOTE — ED Notes (Signed)
Report given to Callie RN  

## 2016-03-22 DIAGNOSIS — R6510 Systemic inflammatory response syndrome (SIRS) of non-infectious origin without acute organ dysfunction: Secondary | ICD-10-CM | POA: Diagnosis not present

## 2016-03-22 DIAGNOSIS — R1084 Generalized abdominal pain: Secondary | ICD-10-CM | POA: Diagnosis not present

## 2016-03-22 DIAGNOSIS — E872 Acidosis: Secondary | ICD-10-CM | POA: Diagnosis not present

## 2016-03-22 LAB — COMPREHENSIVE METABOLIC PANEL
ALBUMIN: 4 g/dL (ref 3.5–5.0)
ALK PHOS: 54 U/L (ref 38–126)
ALT: 19 U/L (ref 17–63)
ANION GAP: 10 (ref 5–15)
AST: 22 U/L (ref 15–41)
BUN: 7 mg/dL (ref 6–20)
CALCIUM: 9.1 mg/dL (ref 8.9–10.3)
CHLORIDE: 101 mmol/L (ref 101–111)
CO2: 24 mmol/L (ref 22–32)
Creatinine, Ser: 0.89 mg/dL (ref 0.61–1.24)
GFR calc non Af Amer: >60 mL/min (ref >60)
GLUCOSE: 150 mg/dL — AB (ref 65–99)
Potassium: 3.7 mmol/L (ref 3.5–5.1)
SODIUM: 135 mmol/L (ref 135–145)
Total Bilirubin: 0.9 mg/dL (ref 0.3–1.2)
Total Protein: 7 g/dL (ref 6.5–8.1)

## 2016-03-22 LAB — RAPID URINE DRUG SCREEN, HOSP PERFORMED
AMPHETAMINES: NOT DETECTED
BARBITURATES: POSITIVE — AB
BENZODIAZEPINES: NOT DETECTED
COCAINE: NOT DETECTED
Opiates: POSITIVE — AB
TETRAHYDROCANNABINOL: POSITIVE — AB

## 2016-03-22 LAB — URINE CULTURE

## 2016-03-22 LAB — LACTIC ACID, PLASMA
LACTIC ACID, VENOUS: 1.4 mmol/L (ref 0.5–1.9)
Lactic Acid, Venous: 1 mmol/L (ref 0.5–1.9)
Lactic Acid, Venous: 2.6 mmol/L (ref 0.5–1.9)

## 2016-03-22 LAB — CBC
HEMATOCRIT: 40 % (ref 39.0–52.0)
HEMOGLOBIN: 13.9 g/dL (ref 13.0–17.0)
MCH: 27.9 pg (ref 26.0–34.0)
MCHC: 34.8 g/dL (ref 30.0–36.0)
MCV: 80.2 fL (ref 78.0–100.0)
Platelets: 266 10*3/uL (ref 150–400)
RBC: 4.99 MIL/uL (ref 4.22–5.81)
RDW: 13.4 % (ref 11.5–15.5)
WBC: 18.3 10*3/uL — ABNORMAL HIGH (ref 4.0–10.5)

## 2016-03-22 LAB — LIPASE, BLOOD: LIPASE: 18 U/L (ref 11–51)

## 2016-03-22 MED ORDER — FAMOTIDINE 20 MG PO TABS
20.0000 mg | ORAL_TABLET | Freq: Two times a day (BID) | ORAL | Status: DC
  Administered 2016-03-22 – 2016-03-24 (×4): 20 mg via ORAL
  Filled 2016-03-22 (×4): qty 1

## 2016-03-22 NOTE — Care Management Note (Signed)
Case Management Note  Patient Details  Name: Herschel Senegalnthony T Milham MRN: [id number] Date of Birth: [birth year]  Subjective/Objective:   Pt in with possible sepsis. On IV antibiotics. He is from home with his spouse.                  Action/Plan: Medical work up pending. CM following for discharge needs.   Expected Discharge Date:                  Expected Discharge Plan:     In-House Referral:     Discharge planning Services     Post Acute Care Choice:    Choice offered to:     DME Arranged:    DME Agency:     HH Arranged:    HH Agency:     Status of Service:  In process, will continue to follow  If discussed at Long Length of Stay Meetings, dates discussed:    Additional Comments:  Kermit BaloKelli F Shawnia Vizcarrondo, RN 03/22/2016, 4:04 PM

## 2016-03-22 NOTE — Progress Notes (Signed)
PROGRESS NOTE    Roberto Goodman  WUJ:[id number] DOB: 1981-07-30 DOA: 03/21/2016 PCP: No PCP Per Patient    Brief Narrative:  Roberto Goodman is an 35 y.o. male who just underwent posterior cervical spinal fusion 2 weeks ago.  He presents to the ED with c/o severe abdominal pain, was found to have elevated lactic acid, hypothermic and elevated wbc count. He was admitted for  Evaluation of sepsis  Assessment & Plan:   Active Problems:   Fever   Abdominal pain   SIRS (systemic inflammatory response syndrome) (HCC)   Lactic acidosis   SIRS: unclear etiology.  Hypothermia resolved.  Lactate improving with hydration. Normal sed rate. UA is negative. Urine cultures and blood cultures are negative so far. CXR unremarkable.  CMP, lipase within normal limits. Abdominal exam is benign.  UDS is positive for barbiturates, opiates and tetrahydrocannabinol.  MRI of the cervical spine shows a 15 mm subcutaneous fluid collection.  Further management as per neurosurgery.  For now recommend to continue the IV antibiotics, till cultures yield , trend lactate and advance diet.    Recent spine surgery: Pain control, PT eval and further recommendations as per neuro surgery.   DVT prophylaxis: (Lovenox/Heparin/SCD's/anticoagulated/None (if comfort care) Code Status: (Full/) Family Communication: family at bedside, discussed the plan of care with the patient.  Disposition Plan: as per primary service.      Procedures: none   Antimicrobials: zosyn 8/7, vancomycin 8/7   Subjective: No nausea, vomiting or abdominal pain.  No new complaints, wants to know if he can go home.   Objective: Vitals:   03/21/16 2319 03/22/16 0145 03/22/16 0545 03/22/16 0929  BP:  137/75 120/75 106/67  Pulse:  70 74 76  Resp:  18 16 18   Temp:  98.4 F (36.9 C) 98.1 F (36.7 C) 98.4 F (36.9 C)  TempSrc:  Oral Oral Oral  SpO2:  99% 98% 100%  Weight: 63.5 kg (140 lb)     Height: 5\' 7"  (1.702 m)        Intake/Output Summary (Last 24 hours) at 03/22/16 1036 Last data filed at 03/21/16 1448  Gross per 24 hour  Intake                0 ml  Output              350 ml  Net             -350 ml   Filed Weights   03/21/16 2319  Weight: 63.5 kg (140 lb)    Examination:  General exam: Appears calm and comfortable  Respiratory system: Clear to auscultation. Respiratory effort normal. Cardiovascular system: S1 & S2 heard, RRR. No JVD, murmurs, rubs, gallops or clicks. No pedal edema. Gastrointestinal system: Abdomen is nondistended, soft and nontender. No organomegaly or masses felt. Normal bowel sounds heard. Central nervous system: Alert and oriented. No focal neurological deficits. Extremities: Symmetric 5 x 5 power. Skin: No rashes, lesions or ulcers Psychiatry: Judgement and insight appear normal. Mood & affect appropriate.     Data Reviewed: I have personally reviewed following labs and imaging studies  CBC:  Recent Labs Lab 03/21/16 1313 03/21/16 1358 03/22/16 0204  WBC 17.3* 16.4* 18.3*  NEUTROABS 14.6* 14.8*  --   HGB 14.7 14.1 13.9  HCT 42.3 41.5 40.0  MCV 80.6 80.4 80.2  PLT 270 269 266   Basic Metabolic Panel:  Recent Labs Lab 03/21/16 1313 03/22/16 0204  NA 139 135  K  2.8* 3.7  CL 103 101  CO2 21* 24  GLUCOSE 150* 150*  BUN 12 7  CREATININE 1.04 0.89  CALCIUM 10.1 9.1   GFR: Estimated Creatinine Clearance: 105 mL/min (by C-G formula based on SCr of 0.89 mg/dL). Liver Function Tests:  Recent Labs Lab 03/21/16 1313 03/22/16 0204  AST 26 22  ALT 21 19  ALKPHOS 49 54  BILITOT 0.9 0.9  PROT 7.1 7.0  ALBUMIN 4.2 4.0    Recent Labs Lab 03/21/16 2334  LIPASE 18   No results for input(s): AMMONIA in the last 168 hours. Coagulation Profile: No results for input(s): INR, PROTIME in the last 168 hours. Cardiac Enzymes: No results for input(s): CKTOTAL, CKMB, CKMBINDEX, TROPONINI in the last 168 hours. BNP (last 3 results) No results for  input(s): PROBNP in the last 8760 hours. HbA1C: No results for input(s): HGBA1C in the last 72 hours. CBG: No results for input(s): GLUCAP in the last 168 hours. Lipid Profile: No results for input(s): CHOL, HDL, LDLCALC, TRIG, CHOLHDL, LDLDIRECT in the last 72 hours. Thyroid Function Tests: No results for input(s): TSH, T4TOTAL, FREET4, T3FREE, THYROIDAB in the last 72 hours. Anemia Panel: No results for input(s): VITAMINB12, FOLATE, FERRITIN, TIBC, IRON, RETICCTPCT in the last 72 hours. Sepsis Labs:  Recent Labs Lab 03/21/16 1408 03/21/16 1616 03/21/16 2334 03/22/16 0204  LATICACIDVEN 4.08* 4.98* 1.0 2.6*    Recent Results (from the past 240 hour(s))  Blood Culture (routine x 2)     Status: None (Preliminary result)   Collection Time: 03/21/16  1:45 PM  Result Value Ref Range Status   Specimen Description BLOOD RIGHT ANTECUBITAL  Final   Special Requests IN PEDIATRIC BOTTLE 3CC  Final   Culture NO GROWTH < 24 HOURS  Final   Report Status PENDING  Incomplete  Blood Culture (routine x 2)     Status: None (Preliminary result)   Collection Time: 03/21/16  2:00 PM  Result Value Ref Range Status   Specimen Description BLOOD LEFT ANTECUBITAL  Final   Special Requests BOTTLES DRAWN AEROBIC AND ANAEROBIC 5CC  Final   Culture NO GROWTH < 24 HOURS  Final   Report Status PENDING  Incomplete         Radiology Studies: Mr Cervical Spine W Wo Contrast  Result Date: 03/21/2016 CLINICAL DATA:  Sepsis. Drainage from surgical site. Posterior cervical fusion 03/09/2016 EXAM: MRI CERVICAL SPINE WITHOUT AND WITH CONTRAST TECHNIQUE: Multiplanar and multiecho pulse sequences of the cervical spine, to include the craniocervical junction and cervicothoracic junction, were obtained according to standard protocol without and with intravenous contrast. CONTRAST:  13mL MULTIHANCE GADOBENATE DIMEGLUMINE 529 MG/ML IV SOLN COMPARISON:  MRI 01/27/2016 FINDINGS: ACDF at C6-7 was present on the prior MRI.  Interval posterior hardware fusion C4 through C7. Normal alignment. Straightening of the cervical lordosis. Negative for fracture or mass lesion. Spinal cord signal normal. Rim enhancing fluid collection is present in the surgical wound in the midline extending down to the lamina of C5 and C6 bilaterally. Fluid extends to the skin surface and there is a 15 mm fluid pocket in the subcutaneous tissues. There is irregular peripheral enhancement. Given the history of purulent drainage in recent surgery, this is most likely a wound infection. C2-3:  Negative C3-4:  Mild disc degeneration without stenosis C4-5:  Mild disc degeneration without stenosis C5-6: Disc degeneration and spondylosis. Mild spinal stenosis. Posterior decompression since the prior study C6-7:  ACDF without stenosis C7-T1: Negative IMPRESSION: Recent posterior hardware fusion  C4 through C7. There a is fluid collection along the surgical wound track with peripheral enhancement suggestive of wound infection. 15 mm fluid collection in the subcutaneous tissues in the midline which appears be draining to the skin surface. No evidence of discitis or osteomyelitis in the cervical spine. Electronically Signed   By: Marlan Palau M.D.   On: 03/21/2016 20:33   Ct Abdomen Pelvis W Contrast  Result Date: 03/21/2016 CLINICAL DATA:  Abdominal pain, nausea and vomiting beginning last night. EXAM: CT ABDOMEN AND PELVIS WITH CONTRAST TECHNIQUE: Multidetector CT imaging of the abdomen and pelvis was performed using the standard protocol following bolus administration of intravenous contrast. CONTRAST:  100 ml ISOVUE-300 IOPAMIDOL (ISOVUE-300) INJECTION 61% COMPARISON:  CT abdomen and pelvis 08/05/2008. FINDINGS: The lung bases are clear.  No pleural or pericardial effusion. The gallbladder, the liver, spleen, adrenal glands, right kidney and pancreas appear normal. The appears to be a small nonobstructing stone in the upper pole left kidney. Small left renal cyst  is noted. There is no lymphadenopathy or fluid. The stomach, small and large bowel and appendix appear normal. The urinary bladder is unremarkable. No focal bony abnormality is seen. IMPRESSION: No acute abnormality abdomen or pelvis. Likely nonobstructing stone upper pole left kidney. Electronically Signed   By: Drusilla Kanner M.D.   On: 03/21/2016 14:42   Dg Chest Port 1 View  Result Date: 03/21/2016 CLINICAL DATA:  Sepsis, chest pain EXAM: PORTABLE CHEST 1 VIEW COMPARISON:  12/24/2012 FINDINGS: Cardiomediastinal silhouette is stable. No acute infiltrate or pleural effusion. No pulmonary edema. Bony thorax is unremarkable. IMPRESSION: No active disease. Electronically Signed   By: Natasha Mead M.D.   On: 03/21/2016 13:58        Scheduled Meds: . famotidine (PEPCID) IV  20 mg Intravenous Q12H  . piperacillin-tazobactam (ZOSYN)  IV  3.375 g Intravenous Q8H  . sodium chloride flush  3 mL Intravenous Q12H  . vancomycin  1,000 mg Intravenous Q8H   Continuous Infusions: . sodium chloride    . sodium chloride    . dextrose 5 % and 0.45 % NaCl with KCl 20 mEq/L 100 mL/hr at 03/21/16 2343     LOS: 0 days    Time spent: 30 minutes.     Kathlen Mody, MD [facility] Pager [phone]  If 7PM-7AM, please contact night-coverage [email or web address] Password [facility] 03/22/2016, 10:36 AM

## 2016-03-22 NOTE — Progress Notes (Signed)
Patient ID: Roberto Goodman, male   DOB: September 02, 1980, 35 y.o.   MRN: [id number] Subjective: Patient reports increased neck soreness today, but belly better  Objective: Vital signs in last 24 hours: Temp:  [96.9 F (36.1 C)-98.4 F (36.9 C)] 98.4 F (36.9 C) (08/08 0929) Pulse Rate:  [63-107] 76 (08/08 0929) Resp:  [10-29] 18 (08/08 0929) BP: (106-162)/(67-106) 106/67 (08/08 0929) SpO2:  [98 %-100 %] 100 % (08/08 0929) Weight:  [63.5 kg (140 lb)] 63.5 kg (140 lb) (08/07 2319)  Intake/Output from previous day: 08/07 0701 - 08/08 0700 In: -  Out: 350 [Urine:350] Intake/Output this shift: No intake/output data recorded.  Neurologic: Grossly normal, incision looks good with minimal drainage if I try to express something from the wound  Lab Results: Lab Results  Component Value Date   WBC 18.3 (H) 03/22/2016   HGB 13.9 03/22/2016   HCT 40.0 03/22/2016   MCV 80.2 03/22/2016   PLT 266 03/22/2016   No results found for: INR, PROTIME BMET Lab Results  Component Value Date   NA 135 03/22/2016   K 3.7 03/22/2016   CL 101 03/22/2016   CO2 24 03/22/2016   GLUCOSE 150 (H) 03/22/2016   BUN 7 03/22/2016   CREATININE 0.89 03/22/2016   CALCIUM 9.1 03/22/2016    Studies/Results: Mr Cervical Spine W Wo Contrast  Result Date: 03/21/2016 CLINICAL DATA:  Sepsis. Drainage from surgical site. Posterior cervical fusion 03/09/2016 EXAM: MRI CERVICAL SPINE WITHOUT AND WITH CONTRAST TECHNIQUE: Multiplanar and multiecho pulse sequences of the cervical spine, to include the craniocervical junction and cervicothoracic junction, were obtained according to standard protocol without and with intravenous contrast. CONTRAST:  13mL MULTIHANCE GADOBENATE DIMEGLUMINE 529 MG/ML IV SOLN COMPARISON:  MRI 01/27/2016 FINDINGS: ACDF at C6-7 was present on the prior MRI. Interval posterior hardware fusion C4 through C7. Normal alignment. Straightening of the cervical lordosis. Negative for fracture or mass lesion.  Spinal cord signal normal. Rim enhancing fluid collection is present in the surgical wound in the midline extending down to the lamina of C5 and C6 bilaterally. Fluid extends to the skin surface and there is a 15 mm fluid pocket in the subcutaneous tissues. There is irregular peripheral enhancement. Given the history of purulent drainage in recent surgery, this is most likely a wound infection. C2-3:  Negative C3-4:  Mild disc degeneration without stenosis C4-5:  Mild disc degeneration without stenosis C5-6: Disc degeneration and spondylosis. Mild spinal stenosis. Posterior decompression since the prior study C6-7:  ACDF without stenosis C7-T1: Negative IMPRESSION: Recent posterior hardware fusion C4 through C7. There a is fluid collection along the surgical wound track with peripheral enhancement suggestive of wound infection. 15 mm fluid collection in the subcutaneous tissues in the midline which appears be draining to the skin surface. No evidence of discitis or osteomyelitis in the cervical spine. Electronically Signed   By: Marlan Palauharles  Clark M.D.   On: 03/21/2016 20:33   Ct Abdomen Pelvis W Contrast  Result Date: 03/21/2016 CLINICAL DATA:  Abdominal pain, nausea and vomiting beginning last night. EXAM: CT ABDOMEN AND PELVIS WITH CONTRAST TECHNIQUE: Multidetector CT imaging of the abdomen and pelvis was performed using the standard protocol following bolus administration of intravenous contrast. CONTRAST:  100 ml ISOVUE-300 IOPAMIDOL (ISOVUE-300) INJECTION 61% COMPARISON:  CT abdomen and pelvis 08/05/2008. FINDINGS: The lung bases are clear.  No pleural or pericardial effusion. The gallbladder, the liver, spleen, adrenal glands, right kidney and pancreas appear normal. The appears to be a small nonobstructing stone  in the upper pole left kidney. Small left renal cyst is noted. There is no lymphadenopathy or fluid. The stomach, small and large bowel and appendix appear normal. The urinary bladder is unremarkable.  No focal bony abnormality is seen. IMPRESSION: No acute abnormality abdomen or pelvis. Likely nonobstructing stone upper pole left kidney. Electronically Signed   By: Drusilla Kanner M.D.   On: 03/21/2016 14:42   Dg Chest Port 1 View  Result Date: 03/21/2016 CLINICAL DATA:  Sepsis, chest pain EXAM: PORTABLE CHEST 1 VIEW COMPARISON:  12/24/2012 FINDINGS: Cardiomediastinal silhouette is stable. No acute infiltrate or pleural effusion. No pulmonary edema. Bony thorax is unremarkable. IMPRESSION: No active disease. Electronically Signed   By: Natasha Mead M.D.   On: 03/21/2016 13:58    Assessment/Plan: Looks good. Continue IV abx (vanco/ zosyn) for now, wound looks good with minimal drainage and he looks great clinically   LOS: 0 days    Farid Grigorian S 03/22/2016, 12:48 PM

## 2016-03-23 DIAGNOSIS — R509 Fever, unspecified: Secondary | ICD-10-CM

## 2016-03-23 DIAGNOSIS — E872 Acidosis: Secondary | ICD-10-CM

## 2016-03-23 DIAGNOSIS — R6510 Systemic inflammatory response syndrome (SIRS) of non-infectious origin without acute organ dysfunction: Secondary | ICD-10-CM

## 2016-03-23 DIAGNOSIS — R1084 Generalized abdominal pain: Secondary | ICD-10-CM

## 2016-03-23 DIAGNOSIS — E876 Hypokalemia: Secondary | ICD-10-CM | POA: Diagnosis not present

## 2016-03-23 LAB — CBC WITH DIFFERENTIAL/PLATELET
BASOS ABS: 0 10*3/uL (ref 0.0–0.1)
BASOS PCT: 0 %
EOS PCT: 1 %
Eosinophils Absolute: 0.1 10*3/uL (ref 0.0–0.7)
HEMATOCRIT: 42.1 % (ref 39.0–52.0)
Hemoglobin: 14.2 g/dL (ref 13.0–17.0)
LYMPHS PCT: 15 %
Lymphs Abs: 1.5 10*3/uL (ref 0.7–4.0)
MCH: 27.4 pg (ref 26.0–34.0)
MCHC: 33.7 g/dL (ref 30.0–36.0)
MCV: 81.1 fL (ref 78.0–100.0)
MONO ABS: 0.7 10*3/uL (ref 0.1–1.0)
Monocytes Relative: 7 %
NEUTROS ABS: 7.6 10*3/uL (ref 1.7–7.7)
Neutrophils Relative %: 77 %
PLATELETS: 278 10*3/uL (ref 150–400)
RBC: 5.19 MIL/uL (ref 4.22–5.81)
RDW: 14.1 % (ref 11.5–15.5)
WBC: 9.9 10*3/uL (ref 4.0–10.5)

## 2016-03-23 LAB — BASIC METABOLIC PANEL
ANION GAP: 9 (ref 5–15)
BUN: 7 mg/dL (ref 6–20)
CALCIUM: 9.2 mg/dL (ref 8.9–10.3)
CO2: 23 mmol/L (ref 22–32)
Chloride: 107 mmol/L (ref 101–111)
Creatinine, Ser: 1 mg/dL (ref 0.61–1.24)
GFR calc Af Amer: >60 mL/min (ref >60)
GLUCOSE: 99 mg/dL (ref 65–99)
POTASSIUM: 4.3 mmol/L (ref 3.5–5.1)
SODIUM: 139 mmol/L (ref 135–145)

## 2016-03-23 MED ORDER — VANCOMYCIN HCL IN DEXTROSE 1-5 GM/200ML-% IV SOLN
1000.0000 mg | Freq: Two times a day (BID) | INTRAVENOUS | Status: DC
  Administered 2016-03-23 – 2016-03-24 (×2): 1000 mg via INTRAVENOUS
  Filled 2016-03-23 (×4): qty 200

## 2016-03-23 MED ORDER — WHITE PETROLATUM GEL
Status: AC
  Administered 2016-03-23: 0.2
  Filled 2016-03-23: qty 1

## 2016-03-23 NOTE — Progress Notes (Signed)
Confirmed with primary RN that discharge was for 8/10 in regards to having PICC inserted.

## 2016-03-23 NOTE — Care Management Note (Addendum)
Case Management Note  Patient Details  Name: Roberto Goodman MRN: [id number] Date of Birth: 1980/10/31  Subjective/Objective:                    Action/Plan: Plan is for patient to discharge home with IV antibiotic therapy. CM met with the patient and provided him a list of [place] agencies that work with his [employer]. He chose [facility]. Pam with [facility] IV therapy notified and accepted the referral. She is going to see patient today and he is to have PICC placed today. Pt given [facility] number to assist him in obtaining a PCP. CM will continue to follow for further d/c needs.   Expected Discharge Date:                  Expected Discharge Plan:  [place]  In-House Referral:     Discharge planning Services  CM Consult  Post Acute Care Choice:  Home Health Choice offered to:     DME Arranged:    DME Agency:     HH Arranged:  RN, IV Antibiotics HH Agency:  [facility]  Status of Service:  In process, will continue to follow  If discussed at Long Length of Stay Meetings, dates discussed:    Additional Comments:  Pollie Friar, RN 03/23/2016, 2:44 PM

## 2016-03-23 NOTE — Progress Notes (Addendum)
Pharmacy Antibiotic Note Herschel Senegalnthony T Zeoli is a 35 y.o. male admitted on 8/7/2017with concern for sepsis in setting of recent posterior cervical fusion.  Pharmacy has been consulted for Zosyn and vancomycin dosing.  Day # 3 of broad spectrum antibiotics Cultures negative to date Scr stable  Plan: Decrease Vancomycin to 1 gram iv Q 12 hours Vancomycin trough in AM prior to 3 rd dose on new Q 12 hour regimen Continue Zosyn --> change to Rocephin for dc home?   Height: 5\' 7"  (170.2 cm) Weight: 140 lb (63.5 kg) IBW/kg (Calculated) : 66.1  Temp (24hrs), Avg:98.1 F (36.7 C), Min:97.5 F (36.4 C), Max:98.7 F (37.1 C)   Recent Labs Lab 03/21/16 1313  03/21/16 1358 03/21/16 1408 03/21/16 1616 03/21/16 2334 03/22/16 0204 03/22/16 1150 03/23/16 0855  WBC 17.3*  --  16.4*  --   --   --  18.3*  --  9.9  CREATININE 1.04  --   --   --   --   --  0.89  --  1.00  LATICACIDVEN  --   < >  --  4.08* 4.98* 1.0 2.6* 1.4  --   < > = values in this interval not displayed.  Estimated Creatinine Clearance: 93.5 mL/min (by C-G formula based on SCr of 1 mg/dL).    No Known Allergies  Antimicrobials this admission: 8/7 Zosyn >>  8/7 vancomycin  >>   Dose adjustments this admission: N/a  Microbiology results: 8/7 BCx: px  Thank you for allowing pharmacy to be a part of this patient's care.  Okey RegalLisa Jamila Slatten, PharmD [phone] 03/23/2016, 12:11 PM

## 2016-03-23 NOTE — Progress Notes (Signed)
Patient ID: Roberto Goodman, male   DOB: 15-Feb-1981, 35 y.o.   MRN: [id number] Subjective: Patient reports neck soreness, no adb pain  Objective: Vital signs in last 24 hours: Temp:  [97.5 F (36.4 C)-98.7 F (37.1 C)] 98 F (36.7 C) (08/09 0503) Pulse Rate:  [56-69] 64 (08/09 0503) Resp:  [18-20] 20 (08/09 0503) BP: (104-118)/(60-69) 104/62 (08/09 0503) SpO2:  [96 %-100 %] 98 % (08/09 0503)  Intake/Output from previous day: No intake/output data recorded. Intake/Output this shift: No intake/output data recorded.  Neurologic: Grossly normal, wound has small 2-3 mm dehiscence with drainage of serosanguinous fluid  Lab Results: Lab Results  Component Value Date   WBC 9.9 03/23/2016   HGB 14.2 03/23/2016   HCT 42.1 03/23/2016   MCV 81.1 03/23/2016   PLT 278 03/23/2016   No results found for: INR, PROTIME BMET Lab Results  Component Value Date   NA 139 03/23/2016   K 4.3 03/23/2016   CL 107 03/23/2016   CO2 23 03/23/2016   GLUCOSE 99 03/23/2016   BUN 7 03/23/2016   CREATININE 1.00 03/23/2016   CALCIUM 9.2 03/23/2016    Studies/Results: Mr Cervical Spine W Wo Contrast  Result Date: 03/21/2016 CLINICAL DATA:  Sepsis. Drainage from surgical site. Posterior cervical fusion 03/09/2016 EXAM: MRI CERVICAL SPINE WITHOUT AND WITH CONTRAST TECHNIQUE: Multiplanar and multiecho pulse sequences of the cervical spine, to include the craniocervical junction and cervicothoracic junction, were obtained according to standard protocol without and with intravenous contrast. CONTRAST:  13mL MULTIHANCE GADOBENATE DIMEGLUMINE 529 MG/ML IV SOLN COMPARISON:  MRI 01/27/2016 FINDINGS: ACDF at C6-7 was present on the prior MRI. Interval posterior hardware fusion C4 through C7. Normal alignment. Straightening of the cervical lordosis. Negative for fracture or mass lesion. Spinal cord signal normal. Rim enhancing fluid collection is present in the surgical wound in the midline extending down to the  lamina of C5 and C6 bilaterally. Fluid extends to the skin surface and there is a 15 mm fluid pocket in the subcutaneous tissues. There is irregular peripheral enhancement. Given the history of purulent drainage in recent surgery, this is most likely a wound infection. C2-3:  Negative C3-4:  Mild disc degeneration without stenosis C4-5:  Mild disc degeneration without stenosis C5-6: Disc degeneration and spondylosis. Mild spinal stenosis. Posterior decompression since the prior study C6-7:  ACDF without stenosis C7-T1: Negative IMPRESSION: Recent posterior hardware fusion C4 through C7. There a is fluid collection along the surgical wound track with peripheral enhancement suggestive of wound infection. 15 mm fluid collection in the subcutaneous tissues in the midline which appears be draining to the skin surface. No evidence of discitis or osteomyelitis in the cervical spine. Electronically Signed   By: Marlan Palau M.D.   On: 03/21/2016 20:33   Ct Abdomen Pelvis W Contrast  Result Date: 03/21/2016 CLINICAL DATA:  Abdominal pain, nausea and vomiting beginning last night. EXAM: CT ABDOMEN AND PELVIS WITH CONTRAST TECHNIQUE: Multidetector CT imaging of the abdomen and pelvis was performed using the standard protocol following bolus administration of intravenous contrast. CONTRAST:  100 ml ISOVUE-300 IOPAMIDOL (ISOVUE-300) INJECTION 61% COMPARISON:  CT abdomen and pelvis 08/05/2008. FINDINGS: The lung bases are clear.  No pleural or pericardial effusion. The gallbladder, the liver, spleen, adrenal glands, right kidney and pancreas appear normal. The appears to be a small nonobstructing stone in the upper pole left kidney. Small left renal cyst is noted. There is no lymphadenopathy or fluid. The stomach, small and large bowel and appendix appear  normal. The urinary bladder is unremarkable. No focal bony abnormality is seen. IMPRESSION: No acute abnormality abdomen or pelvis. Likely nonobstructing stone upper pole  left kidney. Electronically Signed   By: Drusilla Kannerhomas  Dalessio M.D.   On: 03/21/2016 14:42   Dg Chest Port 1 View  Result Date: 03/21/2016 CLINICAL DATA:  Sepsis, chest pain EXAM: PORTABLE CHEST 1 VIEW COMPARISON:  12/24/2012 FINDINGS: Cardiomediastinal silhouette is stable. No acute infiltrate or pleural effusion. No pulmonary edema. Bony thorax is unremarkable. IMPRESSION: No active disease. Electronically Signed   By: Natasha MeadLiviu  Pop M.D.   On: 03/21/2016 13:58    Assessment/Plan: Doing well, will have PICC line placed for about 2 weeks of home IV antibiotics empirically in case this represents some type of superficial wound infection, but I do not believe the irrigation and debridement are necessary. His wound looks quite good with no erythema or swelling or induration and only drainage of some small amount of serosanguineous fluid from a tiny area of dehiscence in the middle of the wound   LOS: 0 days    Sameena Artus S 03/23/2016, 10:22 AM

## 2016-03-23 NOTE — Progress Notes (Signed)
[facility]  Patient Status: new pt for [facility] this admission  [facility] is providing the following services: HHRN and Home Infusion Pharmacy teams to support home IV ABX at home.  [facility] hospital infusion coordinator will provide in hospital teaching to support independence with IV ABX  once home. [facility] hospital team will follow until DC to ensure home care needs are met.  If patient discharges after hours, please call [phone].   Larry Sierras 03/23/2016, 3:11 PM

## 2016-03-23 NOTE — Progress Notes (Signed)
PROGRESS NOTE/consult    Roberto Goodman  WJX:[id number] DOB: Jul 06, 1981 DOA: 03/21/2016 PCP: No PCP Per Patient    Brief Narrative:  Roberto Mangesnthony T Redfearnis an 35 y.o.malewho just underwent posterior cervical spinal fusion 2 weeks ago. He presents to the ED with c/o severe abdominal pain, was found to have elevated lactic acid, hypothermic and elevated wbc count. He was admitted for  Evaluation of sepsis    Assessment & Plan:   Principal Problem:   SIRS (systemic inflammatory response syndrome) (HCC) Active Problems:   Fever   Abdominal pain   Lactic acidosis   Hypokalemia    #1 systemic inflammatory syndrome/fever/abdominal pain/leukocytosis Clinical improvement. Patient currently afebrile. Lactic acid levels have trended down. Patient with no further abdominal pain. CT abdomen and pelvis unremarkable. Leukocytosis trending down. Patient has been pancultured and blood cultures pending with no growth to date. Urine cultures with multiple morphotypes. Chest x-ray with no acute infiltrates in patient with no pulmonary symptoms. Continue empiric IV vancomycin and IV Zosyn. Blood cultures remain negative tomorrow will recommend discontinuing IV vancomycin. Patient's wound site with no significant purulent drainage no induration or erythema. Neurosurgery has ordered a PICC line to be placed for empiric course of IV antibiotics for 2 weeks in case patient may have some superficial wound infection. Per neurosurgery no need for any irrigation and debridement at this time. Follow.  #2 lactic acidosis Resolved.  #3 hypokalemia Repleted.  DVT prophylaxis: SCDs Code Status: Full Family Communication: Updated patient and family at bedside. Disposition Plan: Per primary team.   Consultants:   Internal medicine: [facility] hospitalist Dr. Julian ReilGardner 03/21/2016  Procedures:   CT abdomen and pelvis 03/21/2016  Chest x-ray 03/21/2016  MRI C-spine 03/21/2016  Antimicrobials:   IV Zosyn  03/21/2016  IV vancomycin 03/21/2016   Subjective: Patient denies any abdominal pain. No nausea no vomiting. No chest pain. No shortness of breath. Patient states he's feeling better. No complaints.  Objective: Vitals:   03/23/16 1026 03/23/16 1438 03/23/16 1740 03/23/16 2040  BP: 115/64 (!) 98/57 (!) 112/55 114/66  Pulse: 62 70 74 73  Resp: 18 18 18 20   Temp: 97.9 F (36.6 C) 98.4 F (36.9 C) 98.4 F (36.9 C) 98.7 F (37.1 C)  TempSrc: Oral Oral Oral Oral  SpO2: 97% 100% 100% 100%  Weight:      Height:        Intake/Output Summary (Last 24 hours) at 03/23/16 2046 Last data filed at 03/23/16 1900  Gross per 24 hour  Intake          5466.67 ml  Output                0 ml  Net          5466.67 ml   Filed Weights   03/21/16 2319  Weight: 63.5 kg (140 lb)    Examination:  General exam: Appears calm and comfortable  Respiratory system: Clear to auscultation. Respiratory effort normal. Cardiovascular system: S1 & S2 heard, RRR. No JVD, murmurs, rubs, gallops or clicks. No pedal edema. Gastrointestinal system: Abdomen is nondistended, soft and nontender. No organomegaly or masses felt. Normal bowel sounds heard. Central nervous system: Alert and oriented. No focal neurological deficits. Extremities: Symmetric 5 x 5 power. Skin: Wound site on back of neck covered with Bandage. Wound site nonerythematous, no induration noted, small amount of serosanguineous fluid from the middle area of wound. Psychiatry: Judgement and insight appear normal. Mood & affect appropriate.     Data  Reviewed: I have personally reviewed following labs and imaging studies  CBC:  Recent Labs Lab 03/21/16 1313 03/21/16 1358 03/22/16 0204 03/23/16 0855  WBC 17.3* 16.4* 18.3* 9.9  NEUTROABS 14.6* 14.8*  --  7.6  HGB 14.7 14.1 13.9 14.2  HCT 42.3 41.5 40.0 42.1  MCV 80.6 80.4 80.2 81.1  PLT 270 269 266 278   Basic Metabolic Panel:  Recent Labs Lab 03/21/16 1313 03/22/16 0204  03/23/16 0855  NA 139 135 139  K 2.8* 3.7 4.3  CL 103 101 107  CO2 21* 24 23  GLUCOSE 150* 150* 99  BUN CREATININE 1.04 0.89 1.00  CALCIUM 10.1 9.1 9.2   GFR: Estimated Creatinine Clearance: 93.5 mL/min (by C-G formula based on SCr of 1 mg/dL). Liver Function Tests:  Recent Labs Lab 03/21/16 1313 03/22/16 0204  AST 26 22  ALT 21 19  ALKPHOS 49 54  BILITOT 0.9 0.9  PROT 7.1 7.0  ALBUMIN 4.2 4.0    Recent Labs Lab 03/21/16 2334  LIPASE 18   No results for input(s): AMMONIA in the last 168 hours. Coagulation Profile: No results for input(s): INR, PROTIME in the last 168 hours. Cardiac Enzymes: No results for input(s): CKTOTAL, CKMB, CKMBINDEX, TROPONINI in the last 168 hours. BNP (last 3 results) No results for input(s): PROBNP in the last 8760 hours. HbA1C: No results for input(s): HGBA1C in the last 72 hours. CBG: No results for input(s): GLUCAP in the last 168 hours. Lipid Profile: No results for input(s): CHOL, HDL, LDLCALC, TRIG, CHOLHDL, LDLDIRECT in the last 72 hours. Thyroid Function Tests: No results for input(s): TSH, T4TOTAL, FREET4, T3FREE, THYROIDAB in the last 72 hours. Anemia Panel: No results for input(s): VITAMINB12, FOLATE, FERRITIN, TIBC, IRON, RETICCTPCT in the last 72 hours. Sepsis Labs:  Recent Labs Lab 03/21/16 1616 03/21/16 2334 03/22/16 0204 03/22/16 1150  LATICACIDVEN 4.98* 1.0 2.6* 1.4    Recent Results (from the past 240 hour(s))  Blood Culture (routine x 2)     Status: None (Preliminary result)   Collection Time: 03/21/16  1:45 PM  Result Value Ref Range Status   Specimen Description BLOOD RIGHT ANTECUBITAL  Final   Special Requests IN PEDIATRIC BOTTLE 3CC  Final   Culture NO GROWTH 2 DAYS  Final   Report Status PENDING  Incomplete  Blood Culture (routine x 2)     Status: None (Preliminary result)   Collection Time: 03/21/16  2:00 PM  Result Value Ref Range Status   Specimen Description BLOOD LEFT ANTECUBITAL   Final   Special Requests BOTTLES DRAWN AEROBIC AND ANAEROBIC 5CC  Final   Culture NO GROWTH 2 DAYS  Final   Report Status PENDING  Incomplete  Urine culture     Status: Abnormal   Collection Time: 03/21/16  3:01 PM  Result Value Ref Range Status   Specimen Description URINE, RANDOM  Final   Special Requests NONE  Final   Culture MULTIPLE SPECIES PRESENT, SUGGEST RECOLLECTION (A)  Final   Report Status 03/22/2016 FINAL  Final         Radiology Studies: No results found.      Scheduled Meds: . famotidine  20 mg Oral BID  . piperacillin-tazobactam (ZOSYN)  IV  3.375 g Intravenous Q8H  . sodium chloride flush  3 mL Intravenous Q12H  . vancomycin  1,000 mg Intravenous Q12H   Continuous Infusions: . sodium chloride Stopped (03/23/16 0700)  . sodium chloride Stopped (03/23/16 0700)  . dextrose  5 % and 0.45 % NaCl with KCl 20 mEq/L 10 mL/hr at 03/23/16 1635     LOS: 0 days    Time spent: 35 minutes    Chizara Mena, MD [facility] Pager [phone]  If 7PM-7AM, please contact night-coverage [email or web address] Password [facility] 03/23/2016, 8:46 PM

## 2016-03-24 DIAGNOSIS — R6510 Systemic inflammatory response syndrome (SIRS) of non-infectious origin without acute organ dysfunction: Secondary | ICD-10-CM | POA: Diagnosis not present

## 2016-03-24 DIAGNOSIS — R1084 Generalized abdominal pain: Secondary | ICD-10-CM | POA: Diagnosis not present

## 2016-03-24 DIAGNOSIS — R509 Fever, unspecified: Secondary | ICD-10-CM | POA: Diagnosis not present

## 2016-03-24 DIAGNOSIS — E876 Hypokalemia: Secondary | ICD-10-CM | POA: Diagnosis not present

## 2016-03-24 LAB — BASIC METABOLIC PANEL
Anion gap: 5 (ref 5–15)
BUN: 7 mg/dL (ref 6–20)
CALCIUM: 8.9 mg/dL (ref 8.9–10.3)
CO2: 27 mmol/L (ref 22–32)
CREATININE: 0.98 mg/dL (ref 0.61–1.24)
Chloride: 105 mmol/L (ref 101–111)
GFR calc non Af Amer: >60 mL/min (ref >60)
Glucose, Bld: 94 mg/dL (ref 65–99)
Potassium: 3.7 mmol/L (ref 3.5–5.1)
Sodium: 137 mmol/L (ref 135–145)

## 2016-03-24 LAB — CBC
HEMATOCRIT: 41.2 % (ref 39.0–52.0)
Hemoglobin: 13.8 g/dL (ref 13.0–17.0)
MCH: 27.2 pg (ref 26.0–34.0)
MCHC: 33.5 g/dL (ref 30.0–36.0)
MCV: 81.1 fL (ref 78.0–100.0)
Platelets: 276 10*3/uL (ref 150–400)
RBC: 5.08 MIL/uL (ref 4.22–5.81)
RDW: 14 % (ref 11.5–15.5)
WBC: 8.9 10*3/uL (ref 4.0–10.5)

## 2016-03-24 LAB — VANCOMYCIN, TROUGH: VANCOMYCIN TR: 18 ug/mL (ref 15–20)

## 2016-03-24 MED ORDER — SODIUM CHLORIDE 0.9% FLUSH: 10.0000 mL | INTRAVENOUS | Status: DC | PRN

## 2016-03-24 MED ORDER — DEXTROSE 5 % IV SOLN
2.0000 g | Freq: Three times a day (TID) | INTRAVENOUS | Status: DC
  Administered 2016-03-24 (×2): 2 g via INTRAVENOUS
  Filled 2016-03-24 (×5): qty 2

## 2016-03-24 MED ORDER — HEPARIN SOD (PORK) LOCK FLUSH 100 UNIT/ML IV SOLN
250.0000 [IU] | INTRAVENOUS | Status: AC | PRN
  Administered 2016-03-24: 250 [IU]

## 2016-03-24 MED ORDER — OXYCODONE HCL 10 MG PO TABS: 10.0000 mg | ORAL_TABLET | Freq: Four times a day (QID) | ORAL | 0 refills | Status: DC | PRN

## 2016-03-24 MED ORDER — FAMOTIDINE 20 MG PO TABS: 20.0000 mg | ORAL_TABLET | Freq: Two times a day (BID) | ORAL | 2 refills | Status: DC

## 2016-03-24 MED ORDER — DEXTROSE 5 % IV SOLN: 2.0000 g | Freq: Three times a day (TID) | INTRAVENOUS | 0 refills | Status: AC

## 2016-03-24 MED ORDER — VANCOMYCIN HCL IN DEXTROSE 1-5 GM/200ML-% IV SOLN: 1000.0000 mg | Freq: Two times a day (BID) | INTRAVENOUS | 0 refills | Status: AC

## 2016-03-24 NOTE — Discharge Summary (Signed)
Physician Discharge Summary  Patient ID: Roberto Goodman MRN: [id number] DOB/AGE: 35/15/1982 34 y.o.  Admit date: 03/21/2016 Discharge date: 03/24/2016  Admission Diagnoses: possible wound infection   Discharge Diagnoses: same   Discharged Condition: good  Hospital Course: This patient underwent a posterior cervical fusion a couple of weeks prior to admission. He presented to the emergency department with abdominal pain and lactic acidosis and hypothermia. He was admitted with sepsis protocol. CT scan of the abdomen was negative. MRI of the cervical spine was fairly unimpressive. Sedimentation rate and CRP were normal. His wound looks pretty good. There is a tiny hole in addition of the wound that drained small amounts of serosanguineous fluid only. He was put on vancomycin and Zosyn empirically for possible wound infection. However he never really had any other clinical evidence of wound infection other than the high lactic acid. He had appropriate neck soreness but no arm pain. He mobilized well. He tolerated regular diet. He was discharged on vancomycin and cefepime.  Consults: hospitalists  Significant Diagnostic Studies:  Results for orders placed or performed during the hospital encounter of 03/21/16  Urine culture  Result Value Ref Range   Specimen Description URINE, RANDOM    Special Requests NONE    Culture MULTIPLE SPECIES PRESENT, SUGGEST RECOLLECTION (A)    Report Status 03/22/2016 FINAL   Blood Culture (routine x 2)  Result Value Ref Range   Specimen Description BLOOD RIGHT ANTECUBITAL    Special Requests IN PEDIATRIC BOTTLE 3CC    Culture NO GROWTH 2 DAYS    Report Status PENDING   Blood Culture (routine x 2)  Result Value Ref Range   Specimen Description BLOOD LEFT ANTECUBITAL    Special Requests BOTTLES DRAWN AEROBIC AND ANAEROBIC 5CC    Culture NO GROWTH 2 DAYS    Report Status PENDING   Comprehensive metabolic panel  Result Value Ref Range   Sodium 139 135  - 145 mmol/L   Potassium 2.8 (L) 3.5 - 5.1 mmol/L   Chloride 103 101 - 111 mmol/L   CO2 21 (L) 22 - 32 mmol/L   Glucose, Bld 150 (H) 65 - 99 mg/dL   BUN 12 6 - 20 mg/dL   Creatinine, Ser 4.09 0.61 - 1.24 mg/dL   Calcium 81.1 8.9 - 91.4 mg/dL   Total Protein 7.1 6.5 - 8.1 g/dL   Albumin 4.2 3.5 - 5.0 g/dL   AST 26 15 - 41 U/L   ALT 21 17 - 63 U/L   Alkaline Phosphatase 49 38 - 126 U/L   Total Bilirubin 0.9 0.3 - 1.2 mg/dL   GFR calc non Af Amer >60 >60 mL/min   GFR calc Af Amer >60 >60 mL/min   Anion gap 15 5 - 15  CBC with Differential  Result Value Ref Range   WBC 17.3 (H) 4.0 - 10.5 K/uL   RBC 5.25 4.22 - 5.81 MIL/uL   Hemoglobin 14.7 13.0 - 17.0 g/dL   HCT 78.2 95.6 - 21.3 %   MCV 80.6 78.0 - 100.0 fL   MCH 28.0 26.0 - 34.0 pg   MCHC 34.8 30.0 - 36.0 g/dL   RDW 08.6 57.8 - 46.9 %   Platelets 270 150 - 400 K/uL   Neutrophils Relative % 85 %   Neutro Abs 14.6 (H) 1.7 - 7.7 K/uL   Lymphocytes Relative 8 %   Lymphs Abs 1.4 0.7 - 4.0 K/uL   Monocytes Relative 7 %   Monocytes Absolute 1.2 (H) 0.1 -  1.0 K/uL   Eosinophils Relative 0 %   Eosinophils Absolute 0.0 0.0 - 0.7 K/uL   Basophils Relative 0 %   Basophils Absolute 0.0 0.0 - 0.1 K/uL  Urinalysis, Routine w reflex microscopic  Result Value Ref Range   Color, Urine YELLOW YELLOW   APPearance CLEAR CLEAR   Specific Gravity, Urine 1.031 (H) 1.005 - 1.030   pH 8.0 5.0 - 8.0   Glucose, UA NEGATIVE NEGATIVE mg/dL   Hgb urine dipstick NEGATIVE NEGATIVE   Bilirubin Urine NEGATIVE NEGATIVE   Ketones, ur 15 (A) NEGATIVE mg/dL   Protein, ur NEGATIVE NEGATIVE mg/dL   Nitrite NEGATIVE NEGATIVE   Leukocytes, UA NEGATIVE NEGATIVE  CBC WITH DIFFERENTIAL  Result Value Ref Range   WBC 16.4 (H) 4.0 - 10.5 K/uL   RBC 5.16 4.22 - 5.81 MIL/uL   Hemoglobin 14.1 13.0 - 17.0 g/dL   HCT 16.1 09.6 - 04.5 %   MCV 80.4 78.0 - 100.0 fL   MCH 27.3 26.0 - 34.0 pg   MCHC 34.0 30.0 - 36.0 g/dL   RDW 40.9 81.1 - 91.4 %   Platelets 269 150  - 400 K/uL   Neutrophils Relative % 91 %   Neutro Abs 14.8 (H) 1.7 - 7.7 K/uL   Lymphocytes Relative 5 %   Lymphs Abs 0.9 0.7 - 4.0 K/uL   Monocytes Relative 4 %   Monocytes Absolute 0.7 0.1 - 1.0 K/uL   Eosinophils Relative 0 %   Eosinophils Absolute 0.0 0.0 - 0.7 K/uL   Basophils Relative 0 %   Basophils Absolute 0.0 0.0 - 0.1 K/uL  Sedimentation rate  Result Value Ref Range   Sed Rate 5 0 - 16 mm/hr  C-reactive protein  Result Value Ref Range   CRP 0.6 <1.0 mg/dL  Lipase, blood  Result Value Ref Range   Lipase 18 11 - 51 U/L  Comprehensive metabolic panel  Result Value Ref Range   Sodium 135 135 - 145 mmol/L   Potassium 3.7 3.5 - 5.1 mmol/L   Chloride 101 101 - 111 mmol/L   CO2 24 22 - 32 mmol/L   Glucose, Bld 150 (H) 65 - 99 mg/dL   BUN 7 6 - 20 mg/dL   Creatinine, Ser 7.82 0.61 - 1.24 mg/dL   Calcium 9.1 8.9 - 95.6 mg/dL   Total Protein 7.0 6.5 - 8.1 g/dL   Albumin 4.0 3.5 - 5.0 g/dL   AST 22 15 - 41 U/L   ALT 19 17 - 63 U/L   Alkaline Phosphatase 54 38 - 126 U/L   Total Bilirubin 0.9 0.3 - 1.2 mg/dL   GFR calc non Af Amer >60 >60 mL/min   GFR calc Af Amer >60 >60 mL/min   Anion gap 10 5 - 15  CBC  Result Value Ref Range   WBC 18.3 (H) 4.0 - 10.5 K/uL   RBC 4.99 4.22 - 5.81 MIL/uL   Hemoglobin 13.9 13.0 - 17.0 g/dL   HCT 21.3 08.6 - 57.8 %   MCV 80.2 78.0 - 100.0 fL   MCH 27.9 26.0 - 34.0 pg   MCHC 34.8 30.0 - 36.0 g/dL   RDW 46.9 62.9 - 52.8 %   Platelets 266 150 - 400 K/uL  Lactic acid, plasma  Result Value Ref Range   Lactic Acid, Venous 1.0 0.5 - 1.9 mmol/L  Lactic acid, plasma  Result Value Ref Range   Lactic Acid, Venous 2.6 (HH) 0.5 - 1.9 mmol/L  Urine rapid  drug screen (hosp performed)  Result Value Ref Range   Opiates POSITIVE (A) NONE DETECTED   Cocaine NONE DETECTED NONE DETECTED   Benzodiazepines NONE DETECTED NONE DETECTED   Amphetamines NONE DETECTED NONE DETECTED   Tetrahydrocannabinol POSITIVE (A) NONE DETECTED   Barbiturates  POSITIVE (A) NONE DETECTED  Lactic acid, plasma  Result Value Ref Range   Lactic Acid, Venous 1.4 0.5 - 1.9 mmol/L  Basic metabolic panel  Result Value Ref Range   Sodium 139 135 - 145 mmol/L   Potassium 4.3 3.5 - 5.1 mmol/L   Chloride 107 101 - 111 mmol/L   CO2 23 22 - 32 mmol/L   Glucose, Bld 99 65 - 99 mg/dL   BUN 7 6 - 20 mg/dL   Creatinine, Ser 6.96 0.61 - 1.24 mg/dL   Calcium 9.2 8.9 - 29.5 mg/dL   GFR calc non Af Amer >60 >60 mL/min   GFR calc Af Amer >60 >60 mL/min   Anion gap 9 5 - 15  CBC with Differential/Platelet  Result Value Ref Range   WBC 9.9 4.0 - 10.5 K/uL   RBC 5.19 4.22 - 5.81 MIL/uL   Hemoglobin 14.2 13.0 - 17.0 g/dL   HCT 28.4 13.2 - 44.0 %   MCV 81.1 78.0 - 100.0 fL   MCH 27.4 26.0 - 34.0 pg   MCHC 33.7 30.0 - 36.0 g/dL   RDW 10.2 72.5 - 36.6 %   Platelets 278 150 - 400 K/uL   Neutrophils Relative % 77 %   Neutro Abs 7.6 1.7 - 7.7 K/uL   Lymphocytes Relative 15 %   Lymphs Abs 1.5 0.7 - 4.0 K/uL   Monocytes Relative 7 %   Monocytes Absolute 0.7 0.1 - 1.0 K/uL   Eosinophils Relative 1 %   Eosinophils Absolute 0.1 0.0 - 0.7 K/uL   Basophils Relative 0 %   Basophils Absolute 0.0 0.0 - 0.1 K/uL  Vancomycin, trough  Result Value Ref Range   Vancomycin Tr 18 15 - 20 ug/mL  CBC  Result Value Ref Range   WBC 8.9 4.0 - 10.5 K/uL   RBC 5.08 4.22 - 5.81 MIL/uL   Hemoglobin 13.8 13.0 - 17.0 g/dL   HCT 44.0 34.7 - 42.5 %   MCV 81.1 78.0 - 100.0 fL   MCH 27.2 26.0 - 34.0 pg   MCHC 33.5 30.0 - 36.0 g/dL   RDW 95.6 38.7 - 56.4 %   Platelets 276 150 - 400 K/uL  Basic metabolic panel  Result Value Ref Range   Sodium 137 135 - 145 mmol/L   Potassium 3.7 3.5 - 5.1 mmol/L   Chloride 105 101 - 111 mmol/L   CO2 27 22 - 32 mmol/L   Glucose, Bld 94 65 - 99 mg/dL   BUN 7 6 - 20 mg/dL   Creatinine, Ser 3.32 0.61 - 1.24 mg/dL   Calcium 8.9 8.9 - 95.1 mg/dL   GFR calc non Af Amer >60 >60 mL/min   GFR calc Af Amer >60 >60 mL/min   Anion gap 5 5 - 15  I-Stat  CG4 Lactic Acid, ED  Result Value Ref Range   Lactic Acid, Venous 6.12 (HH) 0.5 - 1.9 mmol/L   Comment NOTIFIED PHYSICIAN   I-Stat CG4 Lactic Acid, ED  (not at  [facility])  Result Value Ref Range   Lactic Acid, Venous 4.08 (HH) 0.5 - 1.9 mmol/L   Comment NOTIFIED PHYSICIAN   I-Stat CG4 Lactic Acid, ED  Result Value Ref  Range   Lactic Acid, Venous 4.98 (HH) 0.5 - 1.9 mmol/L   Comment NOTIFIED PHYSICIAN     Mr Cervical Spine W Wo Contrast  Result Date: 03/21/2016 CLINICAL DATA:  Sepsis. Drainage from surgical site. Posterior cervical fusion 03/09/2016 EXAM: MRI CERVICAL SPINE WITHOUT AND WITH CONTRAST TECHNIQUE: Multiplanar and multiecho pulse sequences of the cervical spine, to include the craniocervical junction and cervicothoracic junction, were obtained according to standard protocol without and with intravenous contrast. CONTRAST:  13mL MULTIHANCE GADOBENATE DIMEGLUMINE 529 MG/ML IV SOLN COMPARISON:  MRI 01/27/2016 FINDINGS: ACDF at C6-7 was present on the prior MRI. Interval posterior hardware fusion C4 through C7. Normal alignment. Straightening of the cervical lordosis. Negative for fracture or mass lesion. Spinal cord signal normal. Rim enhancing fluid collection is present in the surgical wound in the midline extending down to the lamina of C5 and C6 bilaterally. Fluid extends to the skin surface and there is a 15 mm fluid pocket in the subcutaneous tissues. There is irregular peripheral enhancement. Given the history of purulent drainage in recent surgery, this is most likely a wound infection. C2-3:  Negative C3-4:  Mild disc degeneration without stenosis C4-5:  Mild disc degeneration without stenosis C5-6: Disc degeneration and spondylosis. Mild spinal stenosis. Posterior decompression since the prior study C6-7:  ACDF without stenosis C7-T1: Negative IMPRESSION: Recent posterior hardware fusion C4 through C7. There a is fluid collection along the surgical wound track with peripheral enhancement  suggestive of wound infection. 15 mm fluid collection in the subcutaneous tissues in the midline which appears be draining to the skin surface. No evidence of discitis or osteomyelitis in the cervical spine. Electronically Signed   By: Marlan Palau M.D.   On: 03/21/2016 20:33   Ct Abdomen Pelvis W Contrast  Result Date: 03/21/2016 CLINICAL DATA:  Abdominal pain, nausea and vomiting beginning last night. EXAM: CT ABDOMEN AND PELVIS WITH CONTRAST TECHNIQUE: Multidetector CT imaging of the abdomen and pelvis was performed using the standard protocol following bolus administration of intravenous contrast. CONTRAST:  100 ml ISOVUE-300 IOPAMIDOL (ISOVUE-300) INJECTION 61% COMPARISON:  CT abdomen and pelvis 08/05/2008. FINDINGS: The lung bases are clear.  No pleural or pericardial effusion. The gallbladder, the liver, spleen, adrenal glands, right kidney and pancreas appear normal. The appears to be a small nonobstructing stone in the upper pole left kidney. Small left renal cyst is noted. There is no lymphadenopathy or fluid. The stomach, small and large bowel and appendix appear normal. The urinary bladder is unremarkable. No focal bony abnormality is seen. IMPRESSION: No acute abnormality abdomen or pelvis. Likely nonobstructing stone upper pole left kidney. Electronically Signed   By: Drusilla Kanner M.D.   On: 03/21/2016 14:42   Dg Chest Port 1 View  Result Date: 03/21/2016 CLINICAL DATA:  Sepsis, chest pain EXAM: PORTABLE CHEST 1 VIEW COMPARISON:  12/24/2012 FINDINGS: Cardiomediastinal silhouette is stable. No acute infiltrate or pleural effusion. No pulmonary edema. Bony thorax is unremarkable. IMPRESSION: No active disease. Electronically Signed   By: Natasha Mead M.D.   On: 03/21/2016 13:58    Antibiotics:  Anti-infectives    Start     Dose/Rate Route Frequency Ordered Stop   03/24/16 0930  ceFEPIme (MAXIPIME) 2 g in dextrose 5 % 50 mL IVPB     2 g 100 mL/hr over 30 Minutes Intravenous Every 8 hours  03/24/16 0920     03/24/16 0000  ceFEPIme 2 g in dextrose 5 % 50 mL     2 g 100  mL/hr over 30 Minutes Intravenous Every 8 hours 03/24/16 1324 04/05/16 2359   03/24/16 0000  vancomycin (VANCOCIN) 1-5 GM/200ML-% SOLN     1,000 mg 200 mL/hr over 60 Minutes Intravenous Every 12 hours 03/24/16 1324 04/05/16 2359   03/23/16 2000  vancomycin (VANCOCIN) IVPB 1000 mg/200 mL premix     1,000 mg 200 mL/hr over 60 Minutes Intravenous Every 12 hours 03/23/16 1208     03/21/16 2359  ceFAZolin (ANCEF) IVPB 2g/100 mL premix  Status:  Discontinued     2 g 200 mL/hr over 30 Minutes Intravenous Every 8 hours 03/21/16 2234 03/22/16 0621   03/21/16 2200  vancomycin (VANCOCIN) IVPB 1000 mg/200 mL premix  Status:  Discontinued     1,000 mg 200 mL/hr over 60 Minutes Intravenous Every 8 hours 03/21/16 1412 03/23/16 1208   03/21/16 2000  piperacillin-tazobactam (ZOSYN) IVPB 3.375 g  Status:  Discontinued     3.375 g 12.5 mL/hr over 240 Minutes Intravenous Every 8 hours 03/21/16 1411 03/24/16 0832   03/21/16 1345  piperacillin-tazobactam (ZOSYN) IVPB 3.375 g     3.375 g 100 mL/hr over 30 Minutes Intravenous  Once 03/21/16 1339 03/21/16 1522   03/21/16 1345  vancomycin (VANCOCIN) IVPB 1000 mg/200 mL premix     1,000 mg 200 mL/hr over 60 Minutes Intravenous  Once 03/21/16 1339 03/21/16 1614      Discharge Exam: Blood pressure 100/62, pulse 74, temperature 98.3 F (36.8 C), temperature source Oral, resp. rate 18, height 5\' 7"  (1.702 m), weight 63.5 kg (140 lb), SpO2 100 %. Neurologic: Grossly normal Wound ok  Discharge Medications:    Disposition: home   Final Dx: possible wound infection       Signed: JONES,DAVID S 03/24/2016, 3:43 PM

## 2016-03-24 NOTE — Progress Notes (Signed)
RN spoke with in-patient pharmacist which stated scheduled IV Zosyn administration was within limits and that changing future doses was not neccessary. RN instructed scheduled 0800 dose was to remain.

## 2016-03-24 NOTE — Progress Notes (Signed)
Pharmacy Antibiotic Note  Roberto Goodman is a 35 y.o. male admitted on 03/21/2016 on ABX for concern of wound infection on MRI s/p cervical spinal fusion. Continues on vancomycin with Zosyn now changed to cefepime. VT 8/10 therapeutic at 18. Remains afebrile, WBC now WNL, LA normalized. Planning 2 weeks of IV abx at home.   Plan: -Start cefepime 2g q8h -Continue vancomycin 1000mg  q12h -Monitor renal fcn, clinical progress   Height: 5\' 7"  (170.2 cm) Weight: 140 lb (63.5 kg) IBW/kg (Calculated) : 66.1  Temp (24hrs), Avg:98.4 F (36.9 C), Min:97.9 F (36.6 C), Max:98.7 F (37.1 C)   Recent Labs Lab 03/21/16 1313  03/21/16 1358 03/21/16 1408 03/21/16 1616 03/21/16 2334 03/22/16 0204 03/22/16 1150 03/23/16 0855 03/24/16 0300 03/24/16 0708  WBC 17.3*  --  16.4*  --   --   --  18.3*  --  9.9 8.9  --   CREATININE 1.04  --   --   --   --   --  0.89  --  1.00 0.98  --   LATICACIDVEN  --   < >  --  4.08* 4.98* 1.0 2.6* 1.4  --   --   --   VANCOTROUGH  --   --   --   --   --   --   --   --   --   --  18  < > = values in this interval not displayed.  Estimated Creatinine Clearance: 95.4 mL/min (by C-G formula based on SCr of 0.98 mg/dL).    No Known Allergies  Antimicrobials this admission: 8/7 Zosyn >> 8/10 8/10 cefepime >> 8/7 vancomycin >> VT 8/10 = 18  Dose adjustments this admission: VT = 18 on 1000mg  q12h  Microbiology results: 8/7 BCx: NGTD 8/7 UCx: mult species  Thank you for allowing pharmacy to be a part of this patient's care.  Sherle Poeob Vincent, PharmD Clinical Pharmacist 9:24 AM, 03/24/2016

## 2016-03-24 NOTE — Progress Notes (Signed)
Pt discharged home. Discharge instructions and prescriptions given.  Pt will be leaving unit with PICC line and home health nursing for infusion of IV antibiotics.  [facility] will be providing nursing care at patient's home. Infusion teaching performed in the patient room this AM and home health nurse to complete home visit for antibiotic dose and education this evening. Pt questions asked and answered. Pt to leave unit via wheelchair with staff and transport home with spouse. Roberto RadarHeather M Lakeisha Waldrop

## 2016-03-24 NOTE — Progress Notes (Signed)
Peripherally Inserted Central Catheter/Midline Placement  The IV Nurse has discussed with the patient and/or persons authorized to consent for the patient, the purpose of this procedure and the potential benefits and risks involved with this procedure.  The benefits include less needle sticks, lab draws from the catheter and patient may be discharged home with the catheter.  Risks include, but not limited to, infection, bleeding, blood clot (thrombus formation), and puncture of an artery; nerve damage and irregular heat beat.  Alternatives to this procedure were also discussed.  PICC/Midline Placement Documentation  PICC Single Lumen 03/24/16 PICC Right Basilic 40 cm 0 cm (Active)  Indication for Insertion or Continuance of Line Home intravenous therapies (PICC only) 03/24/2016 12:52 PM  Exposed Catheter (cm) 0 cm 03/24/2016 12:52 PM  Dressing Change Due 03/31/16 03/24/2016 12:52 PM       [name], Lajean ManesKerry Loraine 03/24/2016, 12:53 PM

## 2016-03-24 NOTE — Progress Notes (Signed)
PROGRESS NOTE/consult    Roberto Goodman  WUJ:[id number] DOB: 1981-07-24 DOA: 03/21/2016 PCP: No PCP Per Patient    Brief Narrative:  Roberto Vallone Redfearnis an 35 y.o.malewho just underwent posterior cervical spinal fusion 2 weeks ago. He presented to the ED with c/o severe abdominal pain, was found to have elevated lactic acid, hypothermic and elevated wbc count. He was admitted for  Evaluation of sepsis under the neurosurgical team. [facility] were consulted.    Assessment & Plan:   Principal Problem:   SIRS (systemic inflammatory response syndrome) (HCC) Active Problems:   Fever   Abdominal pain   Lactic acidosis   Hypokalemia    #1 systemic inflammatory syndrome/fever/abdominal pain/leukocytosis Clinical improvement. Patient currently afebrile. Lactic acid levels have trended down. Patient with no further abdominal pain. CT abdomen and pelvis unremarkable. Leukocytosis trending down. Patient has been pancultured and blood cultures pending with no growth to date. Urine cultures with multiple morphotypes. Chest x-ray with no acute infiltrates in patient with no pulmonary symptoms. Continue empiric IV vancomycin. Change IV Zosyn to IV cefepime. For coverage for staph, Pseudomonas and possibly strep. Patient's wound site with no significant purulent drainage no induration or erythema. Neurosurgery has ordered a PICC line which has been placed for empiric course of IV antibiotics for 2 weeks in case patient may have some superficial wound infection.  Per neurosurgery no need for any irrigation and debridement at this time Discussed antibiotic coverage with Dr. Daiva Eves of infectious diseases and he recommended for patient to be discharged on IV vancomycin and IV cefepime for coverage for staph, Pseudomonas and Streptococcus species. Patient will be discharged on 12 more days of antibiotics. Weekly labs to be faxed to Dr. Daiva Eves in the ID clinic.. Follow.  #2 lactic  acidosis Resolved.  #3 hypokalemia Repleted.  DVT prophylaxis: SCDs Code Status: Full Family Communication: Updated patient. No family present.  Disposition Plan: Per primary team.   Consultants:   Internal medicine: [facility] hospitalist Dr. Julian Reil 03/21/2016  Procedures:   CT abdomen and pelvis 03/21/2016  Chest x-ray 03/21/2016  MRI C-spine 03/21/2016  PICC line placement 03/24/2016  Antimicrobials:   IV Zosyn 03/21/2016>>>>> 03/24/2016  IV vancomycin 03/21/2016  IV cefepime 03/24/2016   Subjective: Patient denies any abdominal pain. No nausea no vomiting. No chest pain. No shortness of breath. Patient states he's feeling better. Tolerating current diet. No complaints.  Objective: Vitals:   03/23/16 2040 03/24/16 0037 03/24/16 0455 03/24/16 0946  BP: 114/66 108/64 112/67 (!) 92/51  Pulse: 73 68 70 69  Resp: 20 20 20 18   Temp: 98.7 F (37.1 C) 98.5 F (36.9 C) 98.3 F (36.8 C) 98.5 F (36.9 C)  TempSrc: Oral Oral Oral Oral  SpO2: 100% 100% 100% 100%  Weight:      Height:        Intake/Output Summary (Last 24 hours) at 03/24/16 1316 Last data filed at 03/24/16 0216  Gross per 24 hour  Intake          4626.67 ml  Output              850 ml  Net          3776.67 ml   Filed Weights   03/21/16 2319  Weight: 63.5 kg (140 lb)    Examination:  General exam: Appears calm and comfortable  Respiratory system: Clear to auscultation. Respiratory effort normal. Cardiovascular system: S1 & S2 heard, RRR. No JVD, murmurs, rubs, gallops or clicks. No pedal  edema. Gastrointestinal system: Abdomen is nondistended, soft and nontender. No organomegaly or masses felt. Normal bowel sounds heard. Central nervous system: Alert and oriented. No focal neurological deficits. Extremities: Symmetric 5 x 5 power. Skin: Wound site on back of neck covered with Bandage With some drainage noted. Wound site nonerythematous, no induration noted, small amount of serosanguineous  fluid from the middle area of wound. Psychiatry: Judgement and insight appear normal. Mood & affect appropriate.     Data Reviewed: I have personally reviewed following labs and imaging studies  CBC:  Recent Labs Lab 03/21/16 1313 03/21/16 1358 03/22/16 0204 03/23/16 0855 03/24/16 0300  WBC 17.3* 16.4* 18.3* 9.9 8.9  NEUTROABS 14.6* 14.8*  --  7.6  --   HGB 14.7 14.1 13.9 14.2 13.8  HCT 42.3 41.5 40.0 42.1 41.2  MCV 80.6 80.4 80.2 81.1 81.1  PLT 270 269 266 278 276   Basic Metabolic Panel:  Recent Labs Lab 03/21/16 1313 03/22/16 0204 03/23/16 0855 03/24/16 0300  NA 139 135 139 137  K 2.8* 3.7 4.3 3.7  CL 103 101 107 105  CO2 21* 24 23 27   GLUCOSE 150* 150* 99 94  BUN 12 7 7 7   CREATININE 1.04 0.89 1.00 0.98  CALCIUM 10.1 9.1 9.2 8.9   GFR: Estimated Creatinine Clearance: 95.4 mL/min (by C-G formula based on SCr of 0.98 mg/dL). Liver Function Tests:  Recent Labs Lab 03/21/16 1313 03/22/16 0204  AST 26 22  ALT 21 19  ALKPHOS 49 54  BILITOT 0.9 0.9  PROT 7.1 7.0  ALBUMIN 4.2 4.0    Recent Labs Lab 03/21/16 2334  LIPASE 18   No results for input(s): AMMONIA in the last 168 hours. Coagulation Profile: No results for input(s): INR, PROTIME in the last 168 hours. Cardiac Enzymes: No results for input(s): CKTOTAL, CKMB, CKMBINDEX, TROPONINI in the last 168 hours. BNP (last 3 results) No results for input(s): PROBNP in the last 8760 hours. HbA1C: No results for input(s): HGBA1C in the last 72 hours. CBG: No results for input(s): GLUCAP in the last 168 hours. Lipid Profile: No results for input(s): CHOL, HDL, LDLCALC, TRIG, CHOLHDL, LDLDIRECT in the last 72 hours. Thyroid Function Tests: No results for input(s): TSH, T4TOTAL, FREET4, T3FREE, THYROIDAB in the last 72 hours. Anemia Panel: No results for input(s): VITAMINB12, FOLATE, FERRITIN, TIBC, IRON, RETICCTPCT in the last 72 hours. Sepsis Labs:  Recent Labs Lab 03/21/16 1616 03/21/16 2334  03/22/16 0204 03/22/16 1150  LATICACIDVEN 4.98* 1.0 2.6* 1.4    Recent Results (from the past 240 hour(s))  Blood Culture (routine x 2)     Status: None (Preliminary result)   Collection Time: 03/21/16  1:45 PM  Result Value Ref Range Status   Specimen Description BLOOD RIGHT ANTECUBITAL  Final   Special Requests IN PEDIATRIC BOTTLE 3CC  Final   Culture NO GROWTH 2 DAYS  Final   Report Status PENDING  Incomplete  Blood Culture (routine x 2)     Status: None (Preliminary result)   Collection Time: 03/21/16  2:00 PM  Result Value Ref Range Status   Specimen Description BLOOD LEFT ANTECUBITAL  Final   Special Requests BOTTLES DRAWN AEROBIC AND ANAEROBIC 5CC  Final   Culture NO GROWTH 2 DAYS  Final   Report Status PENDING  Incomplete  Urine culture     Status: Abnormal   Collection Time: 03/21/16  3:01 PM  Result Value Ref Range Status   Specimen Description URINE, RANDOM  Final  Special Requests NONE  Final   Culture MULTIPLE SPECIES PRESENT, SUGGEST RECOLLECTION (A)  Final   Report Status 03/22/2016 FINAL  Final         Radiology Studies: No results found.      Scheduled Meds: . ceFEPime (MAXIPIME) IV  2 g Intravenous Q8H  . famotidine  20 mg Oral BID  . sodium chloride flush  3 mL Intravenous Q12H  . vancomycin  1,000 mg Intravenous Q12H   Continuous Infusions: . sodium chloride Stopped (03/23/16 0700)  . dextrose 5 % and 0.45 % NaCl with KCl 20 mEq/L 10 mL/hr at 03/23/16 1635     LOS: 0 days    Time spent: 35 minutes    Jinx Gilden, MD [facility] Pager [phone]  If 7PM-7AM, please contact night-coverage [email or web address] Password [email or web address] 03/24/2016, 1:16 PM

## 2016-03-25 NOTE — Care Management Note (Signed)
Case Management Note  Patient Details  Name: Roberto Goodman MRN: [id number] Date of Birth: [birth year]  Subjective/Objective:                    Action/Plan: [facility]. [facility]. Bedside RN was updated.  Expected Discharge Date:                  Expected Discharge Plan:  Home w Home Health Services  In-House Referral:     Discharge planning Services  CM Consult  Post Acute Care Choice:  Home Health Choice offered to:     DME Arranged:    DME Agency:     HH Arranged:  RN, IV Antibiotics HH Agency:  [facility]  Status of Service:  Completed, signed off  If discussed at Long Length of Stay Meetings, dates discussed:    Additional Comments:  Kermit BaloKelli F Robel Wuertz, RN 03/25/2016, 8:36 AM

## 2016-03-26 LAB — CULTURE, BLOOD (ROUTINE X 2)
CULTURE: NO GROWTH
CULTURE: NO GROWTH

## 2016-05-17 ENCOUNTER — Ambulatory Visit (INDEPENDENT_AMBULATORY_CARE_PROVIDER_SITE_OTHER): Payer: Managed Care, Other (non HMO)

## 2016-05-17 ENCOUNTER — Encounter (HOSPITAL_COMMUNITY): Payer: Self-pay | Admitting: Emergency Medicine

## 2016-05-17 ENCOUNTER — Ambulatory Visit (HOSPITAL_COMMUNITY)
Admission: EM | Admit: 2016-05-17 | Discharge: 2016-05-17 | Disposition: A | Discharge status: Home / Self Care | Payer: Managed Care, Other (non HMO) | Attending: Family Medicine | Admitting: Family Medicine

## 2016-05-17 DIAGNOSIS — S161XXA Strain of muscle, fascia and tendon at neck level, initial encounter: Secondary | ICD-10-CM | POA: Diagnosis not present

## 2016-05-17 DIAGNOSIS — M7918 Myalgia, other site: Secondary | ICD-10-CM

## 2016-05-17 DIAGNOSIS — M791 Myalgia: Secondary | ICD-10-CM | POA: Diagnosis not present
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
## 2016-05-17 NOTE — Discharge Instructions (Signed)
Continue symptomatic treatment   Cold compresses to sore areas  Ibuprofen along with your regular meds for discomfort.
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
## 2016-05-26 ENCOUNTER — Encounter (HOSPITAL_COMMUNITY): Payer: Self-pay | Admitting: Emergency Medicine

## 2016-05-26 ENCOUNTER — Ambulatory Visit (HOSPITAL_COMMUNITY)
Admission: EM | Admit: 2016-05-26 | Discharge: 2016-05-26 | Disposition: A | Discharge status: Home / Self Care | Payer: Managed Care, Other (non HMO) | Attending: Internal Medicine | Admitting: Internal Medicine

## 2016-05-26 DIAGNOSIS — R110 Nausea: Secondary | ICD-10-CM

## 2016-05-26 DIAGNOSIS — R197 Diarrhea, unspecified: Secondary | ICD-10-CM

## 2016-05-26 DIAGNOSIS — K529 Noninfective gastroenteritis and colitis, unspecified: Secondary | ICD-10-CM

## 2016-05-26 MED ORDER — ONDANSETRON HCL 4 MG/2ML IJ SOLN
4.0000 mg | Freq: Once | INTRAMUSCULAR | Status: AC
  Administered 2016-05-26: 4 mg via INTRAMUSCULAR

## 2016-05-26 MED ORDER — ONDANSETRON 8 MG PO TBDP: 8.0000 mg | ORAL_TABLET | Freq: Three times a day (TID) | ORAL | 0 refills | Status: DC | PRN

## 2016-05-26 MED ORDER — ONDANSETRON 4 MG PO TBDP
ORAL_TABLET | ORAL | Status: AC
  Filled 2016-05-26: qty 2

## 2016-05-26 MED ORDER — ONDANSETRON 4 MG PO TBDP
4.0000 mg | ORAL_TABLET | Freq: Once | ORAL | Status: AC
  Administered 2016-05-26: 4 mg via ORAL

## 2016-05-26 MED ORDER — ONDANSETRON HCL 4 MG/2ML IJ SOLN
INTRAMUSCULAR | Status: AC
  Filled 2016-05-26: qty 2

## 2016-05-26 NOTE — ED Notes (Signed)
Patient remains in restroom.

## 2016-05-26 NOTE — ED Triage Notes (Signed)
Patient reports no appetite.  Complains of abdominal pain, alternating sweating episodes with chills.  Reports one major diarrhea stool this morning.  Since then nausea, no vomiting, cramping stomach pain.

## 2016-05-26 NOTE — ED Provider Notes (Signed)
CSN: [id number]     Arrival date & time 05/26/16  1650 History   None    No chief complaint on file.  (Consider location/radiation/quality/duration/timing/severity/associated sxs/prior Treatment) Patient c/o NVD since this am and chills.  He has severe nausea at present.   The history is provided by the patient.  Abdominal Pain  Pain location:  Generalized Pain quality: cramping   Pain radiates to:  Does not radiate Pain severity:  Moderate Onset quality:  Sudden Duration:  6 hours Timing:  Constant Progression:  Worsening Chronicity:  New Relieved by:  Nothing Worsened by:  Nothing Ineffective treatments:  None tried Associated symptoms: anorexia, chills, diarrhea, fatigue, nausea and vomiting     No past medical history on file. Past Surgical History:  Procedure Laterality Date  . BACK SURGERY    . CERVICAL FUSION     No family history on file. Social History  Substance Use Topics  . Smoking status: Current Every Day Smoker  . Smokeless tobacco: Never Used  . Alcohol use Yes    Review of Systems  Constitutional: Positive for chills and fatigue.  HENT: Negative.   Eyes: Negative.   Respiratory: Negative.   Cardiovascular: Negative.   Gastrointestinal: Positive for abdominal pain, anorexia, diarrhea, nausea and vomiting.  Endocrine: Negative.   Genitourinary: Negative.   Musculoskeletal: Negative.   Skin: Negative.   Allergic/Immunologic: Negative.   Neurological: Negative.   Hematological: Negative.   Psychiatric/Behavioral: Negative.     Allergies  Review of patient's allergies indicates no known allergies.  Home Medications   Prior to Admission medications   Medication Sig Start Date End Date Taking? Authorizing Provider  cyclobenzaprine (FLEXERIL) 10 MG tablet Take 1 tablet (10 mg total) by mouth 3 (three) times daily as needed for muscle spasms. 11/27/14   Charlestine Nighthristopher Lawyer, PA-C  famotidine (PEPCID) 20 MG tablet Take 1 tablet (20 mg total) by  mouth 2 (two) times daily. 03/24/16   Rodolph Bonganiel V Thompson, MD  ibuprofen (ADVIL,MOTRIN) 200 MG tablet Take 200 mg by mouth every 6 (six) hours as needed for headache or mild pain.     Historical Provider, MD  Oxycodone HCl 10 MG TABS Take 1 tablet (10 mg total) by mouth 4 (four) times daily as needed (for pain). 03/24/16   Tia Alertavid S Jones, MD   Meds Ordered and Administered this Visit  Medications - No data to display  There were no vitals taken for this visit. No data found.   Physical Exam  Constitutional: He appears well-developed and well-nourished.  HENT:  Head: Normocephalic and atraumatic.  Mouth/Throat: Oropharynx is clear and moist.  Eyes: Conjunctivae and EOM are normal. Pupils are equal, round, and reactive to light.  Neck: Normal range of motion. Neck supple.  Cardiovascular: Normal rate, regular rhythm and normal heart sounds.   Pulmonary/Chest: Effort normal and breath sounds normal.  Abdominal: Soft. There is tenderness.  Abdomen soft tender in RUQ and LUQ and LLQ and LUQ and bowel sounds are hyperactive.  Nursing note and vitals reviewed.   Urgent Care Course   Clinical Course    Procedures (including critical care time)  Labs Review Labs Reviewed - No data to display  Imaging Review No results found.   Visual Acuity Review  Right Eye Distance:   Left Eye Distance:   Bilateral Distance:    Right Eye Near:   Left Eye Near:    Bilateral Near:         MDM  Viral Gastroenteritis Zofran 8mg   po now  Push po fluids, rest, tylenol and motrin otc prn as directed for fever, arthralgias, and myalgias.  Follow up prn if sx's continue or persist.   Deatra Canter, FNP 05/26/16 1850

## 2017-05-09 ENCOUNTER — Emergency Department (HOSPITAL_COMMUNITY): Payer: No Typology Code available for payment source

## 2017-05-09 ENCOUNTER — Emergency Department (HOSPITAL_COMMUNITY)
Admission: EM | Admit: 2017-05-09 | Discharge: 2017-05-10 | Disposition: A | Discharge status: Home / Self Care | Payer: No Typology Code available for payment source | Attending: Emergency Medicine | Admitting: Emergency Medicine

## 2017-05-09 ENCOUNTER — Encounter (HOSPITAL_COMMUNITY): Payer: Self-pay | Admitting: Emergency Medicine

## 2017-05-09 DIAGNOSIS — F17200 Nicotine dependence, unspecified, uncomplicated: Secondary | ICD-10-CM | POA: Insufficient documentation

## 2017-05-09 DIAGNOSIS — M542 Cervicalgia: Secondary | ICD-10-CM | POA: Insufficient documentation

## 2017-05-09 DIAGNOSIS — Z79899 Other long term (current) drug therapy: Secondary | ICD-10-CM | POA: Insufficient documentation

## 2017-05-09 DIAGNOSIS — Y998 Other external cause status: Secondary | ICD-10-CM | POA: Insufficient documentation

## 2017-05-09 DIAGNOSIS — M549 Dorsalgia, unspecified: Secondary | ICD-10-CM | POA: Insufficient documentation

## 2017-05-09 DIAGNOSIS — Y92414 Local residential or business street as the place of occurrence of the external cause: Secondary | ICD-10-CM | POA: Diagnosis not present

## 2017-05-09 DIAGNOSIS — Y939 Activity, unspecified: Secondary | ICD-10-CM | POA: Diagnosis not present

## 2017-05-09 DIAGNOSIS — M791 Myalgia: Secondary | ICD-10-CM | POA: Insufficient documentation

## 2017-05-09 MED ORDER — METHOCARBAMOL 500 MG PO TABS: 500.0000 mg | ORAL_TABLET | Freq: Every evening | ORAL | 0 refills | Status: DC | PRN

## 2017-05-09 NOTE — Discharge Instructions (Signed)
Take Ibuprofen three times daily for the next week. Take this medicine with food. °Take muscle relaxer at bedtime to help you sleep. This medicine makes you drowsy so do not take before driving or work °Use a heating pad for sore muscles - use for 20 minutes several times a day °Return for worsening symptoms ° °
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
## 2017-05-28 ENCOUNTER — Emergency Department (HOSPITAL_COMMUNITY)
Admission: EM | Admit: 2017-05-28 | Discharge: 2017-05-29 | Disposition: A | Discharge status: Home / Self Care | Payer: Self-pay | Attending: Emergency Medicine | Admitting: Emergency Medicine

## 2017-05-28 ENCOUNTER — Encounter (HOSPITAL_COMMUNITY): Payer: Self-pay | Admitting: Emergency Medicine

## 2017-05-28 DIAGNOSIS — F17200 Nicotine dependence, unspecified, uncomplicated: Secondary | ICD-10-CM | POA: Insufficient documentation

## 2017-05-28 DIAGNOSIS — R1084 Generalized abdominal pain: Secondary | ICD-10-CM | POA: Insufficient documentation

## 2017-05-28 DIAGNOSIS — R112 Nausea with vomiting, unspecified: Secondary | ICD-10-CM | POA: Insufficient documentation

## 2017-05-28 DIAGNOSIS — R197 Diarrhea, unspecified: Secondary | ICD-10-CM | POA: Insufficient documentation

## 2017-05-28 DIAGNOSIS — Z79899 Other long term (current) drug therapy: Secondary | ICD-10-CM | POA: Insufficient documentation

## 2017-05-28 LAB — COMPREHENSIVE METABOLIC PANEL
ALBUMIN: 4.5 g/dL (ref 3.5–5.0)
ALK PHOS: 39 U/L (ref 38–126)
ALT: 15 U/L — AB (ref 17–63)
AST: 26 U/L (ref 15–41)
Anion gap: 15 (ref 5–15)
BUN: 10 mg/dL (ref 6–20)
CHLORIDE: 100 mmol/L — AB (ref 101–111)
CO2: 22 mmol/L (ref 22–32)
Calcium: 9.3 mg/dL (ref 8.9–10.3)
Creatinine, Ser: 1.11 mg/dL (ref 0.61–1.24)
GFR calc non Af Amer: >60 mL/min (ref >60)
Glucose, Bld: 134 mg/dL — ABNORMAL HIGH (ref 65–99)
Potassium: 3.5 mmol/L (ref 3.5–5.1)
SODIUM: 137 mmol/L (ref 135–145)
TOTAL PROTEIN: 7 g/dL (ref 6.5–8.1)
Total Bilirubin: 0.9 mg/dL (ref 0.3–1.2)

## 2017-05-28 LAB — CBC
HCT: 39.3 % (ref 39.0–52.0)
HEMOGLOBIN: 13 g/dL (ref 13.0–17.0)
MCH: 27.3 pg (ref 26.0–34.0)
MCHC: 33.1 g/dL (ref 30.0–36.0)
MCV: 82.6 fL (ref 78.0–100.0)
Platelets: 204 10*3/uL (ref 150–400)
RBC: 4.76 MIL/uL (ref 4.22–5.81)
RDW: 14.2 % (ref 11.5–15.5)
WBC: 21.2 10*3/uL — ABNORMAL HIGH (ref 4.0–10.5)

## 2017-05-28 LAB — LIPASE, BLOOD: LIPASE: 19 U/L (ref 11–51)

## 2017-05-28 MED ORDER — ONDANSETRON 4 MG PO TBDP: 4.0000 mg | ORAL_TABLET | Freq: Once | ORAL | Status: DC | PRN

## 2017-05-28 MED ORDER — ONDANSETRON 4 MG PO TBDP
ORAL_TABLET | ORAL | Status: AC
  Administered 2017-05-28: 4 mg
  Filled 2017-05-28: qty 1

## 2017-05-28 NOTE — ED Triage Notes (Signed)
About 2000 today he began to experience n/v/d, chills and abdominal cramping.  Pt does not have an oral fever.

## 2017-05-28 NOTE — ED Notes (Signed)
Pt unable to void at this time. Pt given specimen cup 

## 2017-05-29 ENCOUNTER — Emergency Department (HOSPITAL_COMMUNITY): Payer: Self-pay

## 2017-05-29 ENCOUNTER — Encounter (HOSPITAL_COMMUNITY): Payer: Self-pay | Admitting: Radiology

## 2017-05-29 LAB — I-STAT CG4 LACTIC ACID, ED: Lactic Acid, Venous: 1.78 mmol/L (ref 0.5–1.9)

## 2017-05-29 MED ORDER — ONDANSETRON HCL 4 MG/2ML IJ SOLN
4.0000 mg | Freq: Once | INTRAMUSCULAR | Status: AC
  Administered 2017-05-29: 4 mg via INTRAVENOUS
  Filled 2017-05-29: qty 2

## 2017-05-29 MED ORDER — IOPAMIDOL (ISOVUE-300) INJECTION 61%
INTRAVENOUS | Status: AC
  Administered 2017-05-29: 100 mL
  Filled 2017-05-29: qty 100

## 2017-05-29 MED ORDER — ONDANSETRON 4 MG PO TBDP: 4.0000 mg | ORAL_TABLET | Freq: Three times a day (TID) | ORAL | 0 refills | Status: DC | PRN

## 2017-05-29 MED ORDER — SODIUM CHLORIDE 0.9 % IV BOLUS (SEPSIS)
1000.0000 mL | Freq: Once | INTRAVENOUS | Status: AC
  Administered 2017-05-29: 1000 mL via INTRAVENOUS

## 2017-05-29 MED ORDER — MORPHINE SULFATE (PF) 4 MG/ML IV SOLN
4.0000 mg | Freq: Once | INTRAVENOUS | Status: AC
  Administered 2017-05-29: 4 mg via INTRAVENOUS
  Filled 2017-05-29: qty 1

## 2017-05-29 MED ORDER — DICYCLOMINE HCL 20 MG PO TABS: 20.0000 mg | ORAL_TABLET | Freq: Two times a day (BID) | ORAL | 0 refills | Status: DC

## 2017-05-29 NOTE — ED Provider Notes (Signed)
[facility] Provider Note   CSN: [id number] Arrival date & time: 05/28/17  2148     History   Chief Complaint Chief Complaint  Patient presents with  . Chills  . Emesis  . Abdominal Cramping    HPI Roberto Goodman is a 36 y.o. male.  The history is provided by the patient and medical records.  Emesis   Associated symptoms include abdominal pain and diarrhea.  Abdominal Cramping  Associated symptoms include abdominal pain.    36 y.o. M with hx of UTI's and cervical fusion, presenting to the ED for abdominal pain.  States this started abruptly around 7:30pm this evening.  He reports generalized abdominal pain which he describes as cramping, but worse in the lower abdomen.  Has had multiple episodes of vomiting and diarrhea as well.  Reports subjective fever, chills, and sweating.  Wife is concerned as last time he had these symptoms he ended up admitted to the hospital with "abdominal infection".  States he was here for several days on IV abx.  No prior abdominal surgeries.  No meds tried PTA.  History reviewed. No pertinent past medical history.  Patient Active Problem List   Diagnosis Date Noted  . Hypokalemia 03/23/2016  . Fever 03/21/2016  . Generalized abdominal pain 03/21/2016  . SIRS (systemic inflammatory response syndrome) (HCC) 03/21/2016  . Lactic acidosis 03/21/2016  . UTI (urinary tract infection) 12/28/2012    Past Surgical History:  Procedure Laterality Date  . BACK SURGERY    . CERVICAL FUSION         Home Medications    Prior to Admission medications   Medication Sig Start Date End Date Taking? Authorizing Provider  ibuprofen (ADVIL,MOTRIN) 200 MG tablet Take 200 mg by mouth every 6 (six) hours as needed for headache or mild pain.     [provider]  methocarbamol (ROBAXIN) 500 MG tablet Take 1 tablet (500 mg total) by mouth at bedtime and may repeat dose one time if needed. 05/09/17   Bethel Born, PA-C    Family  History No family history on file.  Social History Social History  Substance Use Topics  . Smoking status: Current Every Day Smoker  . Smokeless tobacco: Never Used  . Alcohol use Yes     Allergies   Patient has no known allergies.   Review of Systems Review of Systems  Gastrointestinal: Positive for abdominal pain, diarrhea, nausea and vomiting.  All other systems reviewed and are negative.    Physical Exam Updated Vital Signs BP 129/75 (BP Location: Right Arm)   Pulse 60   Temp 97.7 F (36.5 C) (Oral)   Resp 18   Ht  (1.702 m)   Wt 64.4 kg (142 lb)   SpO2 100%   BMI 22.24 kg/m   Physical Exam  Constitutional: He is oriented to person, place, and time. He appears well-developed and well-nourished.  Diaphoretic, pale, shivering during exam  HENT:  Head: Normocephalic and atraumatic.  Mouth/Throat: Oropharynx is clear and moist.  Eyes: Pupils are equal, round, and reactive to light. Conjunctivae and EOM are normal.  Neck: Normal range of motion.  Cardiovascular: Normal rate, regular rhythm and normal heart sounds.   Pulmonary/Chest: Effort normal and breath sounds normal.  Abdominal: Soft. Bowel sounds are normal. There is generalized tenderness.  Generalized tenderness but worse in lower abdomen, voluntary guarding  Musculoskeletal: Normal range of motion.  Neurological: He is alert and oriented to person, place, and time.  Skin:  Skin is warm and dry.  Psychiatric: He has a normal mood and affect.  Nursing note and vitals reviewed.    ED Treatments / Results  Labs (all labs ordered are listed, but only abnormal results are displayed) Labs Reviewed  COMPREHENSIVE METABOLIC PANEL - Abnormal; Notable for the following:       Result Value   Chloride 100 (*)    Glucose, Bld 134 (*)    ALT 15 (*)    All other components within normal limits  CBC - Abnormal; Notable for the following:    WBC 21.2 (*)    All other components within normal limits    LIPASE, BLOOD  URINALYSIS, ROUTINE W REFLEX MICROSCOPIC  I-STAT CG4 LACTIC ACID, ED  I-STAT CG4 LACTIC ACID, ED    EKG  EKG Interpretation None       Radiology Ct Abdomen Pelvis W Contrast  Result Date: 05/29/2017 CLINICAL DATA:  Periumbilical abdominal pain with vomiting for 1 day. EXAM: CT ABDOMEN AND PELVIS WITH CONTRAST TECHNIQUE: Multidetector CT imaging of the abdomen and pelvis was performed using the standard protocol following bolus administration of intravenous contrast. CONTRAST:  ISOVUE-300 IOPAMIDOL (ISOVUE-300) INJECTION 61% COMPARISON:  03/21/2016 FINDINGS: Lower chest: No acute abnormality. Hepatobiliary: No focal liver abnormality is seen. No gallstones, gallbladder wall thickening, or biliary dilatation. Pancreas: Unremarkable. No pancreatic ductal dilatation or surrounding inflammatory changes. Spleen: Normal in size without focal abnormality. Adrenals/Urinary Tract: Adrenal glands are unremarkable. Kidneys are normal, without renal calculi, focal lesion, or hydronephrosis. Bladder is unremarkable. Stomach/Bowel: Stomach is within normal limits. Appendix appears normal. No evidence of bowel wall thickening, distention, or inflammatory changes. Vascular/Lymphatic: No significant vascular findings are present. No enlarged abdominal or pelvic lymph nodes. Reproductive: Unremarkable Other: No focal inflammation.  No ascites. Musculoskeletal: No significant skeletal abnormality. IMPRESSION: No significant abnormality. Electronically Signed   By: Ellery Plunk M.D.   On: 05/29/2017 01:54    Procedures Procedures (including critical care time)  Medications Ordered in ED Medications  ondansetron (ZOFRAN-ODT) disintegrating tablet 4 mg (not administered)  sodium chloride 0.9 % bolus 1,000 mL (not administered)  morphine 4 MG/ML injection 4 mg (not administered)  ondansetron (ZOFRAN) injection 4 mg (not administered)  ondansetron (ZOFRAN-ODT) 4 MG disintegrating  tablet (4 mg  Given 05/28/17 2238)     Initial Impression / Assessment and Plan / ED Course  I have reviewed the triage vital signs and the nursing notes.  Pertinent labs & imaging results that were available during my care of the patient were reviewed by me and considered in my medical decision making (see chart for details).  36 year old male here with abdominal cramping, nausea, vomiting, and diarrhea since 7:30 PM. Symptoms were abrupt in onset. He denies any sick contacts. Patient appears uncomfortable, pale, and sweaty on initial exam. Generalized tenderness. Screening labs obtained from triage, leukocytosis of 21,000 noted. Given his overall appearance and reported history of hospitalization due to "abdominal infection", will plan for CT scan for further evaluation. IV fluids and medications ordered for symptom control.  2:12 AM Patient feeling better.  No active vomiting here.  Requesting to drink at this time.  Will PO challenge and if tolerating well likely can discharge home.  Labs and CT reviewed with patient and wife, they acknowledged understanding.  3:05 AM Patient has tolerated ice chips and 8oz can of sprite.  States he feels well enough to go home.  VSS.  Will d/c home with symptomatic care.  discussed clear liquids, bland  diet for now and progress back to normal as tolerated.  Follow-up with PCP if any ongoing issues.  Discussed plan with patient and wife, they acknowledged understanding and agreed with plan of care.  Return precautions given for new or worsening symptoms.  Final Clinical Impressions(s) / ED Diagnoses   Final diagnoses:  Nausea vomiting and diarrhea    New Prescriptions New Prescriptions   DICYCLOMINE (BENTYL) 20 MG TABLET    Take 1 tablet (20 mg total) by mouth 2 (two) times daily.   ONDANSETRON (ZOFRAN ODT) 4 MG DISINTEGRATING TABLET    Take 1 tablet (4 mg total) by mouth every 8 (eight) hours as needed for nausea.     Garlon Hatchet, PA-C 05/29/17  [id number]    Shon Baton, MD 05/30/17 [phone]

## 2017-05-29 NOTE — Discharge Instructions (Signed)
Take the prescribed medication as directed.  Try to stick with clear liquids, bland diet for now and progress back to normal as tolerated. Follow-up with your primary care doctor if any ongoing issues. Return to the ED for new or worsening symptoms.

## 2017-09-09 ENCOUNTER — Other Ambulatory Visit: Payer: Self-pay

## 2017-09-09 ENCOUNTER — Emergency Department (HOSPITAL_COMMUNITY): Payer: BLUE CROSS/BLUE SHIELD

## 2017-09-09 ENCOUNTER — Encounter (HOSPITAL_COMMUNITY): Payer: Self-pay | Admitting: Emergency Medicine

## 2017-09-09 ENCOUNTER — Emergency Department (HOSPITAL_COMMUNITY)
Admission: EM | Admit: 2017-09-09 | Discharge: 2017-09-09 | Disposition: A | Discharge status: Home / Self Care | Payer: BLUE CROSS/BLUE SHIELD | Attending: Emergency Medicine | Admitting: Emergency Medicine

## 2017-09-09 DIAGNOSIS — Z79899 Other long term (current) drug therapy: Secondary | ICD-10-CM | POA: Insufficient documentation

## 2017-09-09 DIAGNOSIS — F17210 Nicotine dependence, cigarettes, uncomplicated: Secondary | ICD-10-CM | POA: Diagnosis not present

## 2017-09-09 DIAGNOSIS — R079 Chest pain, unspecified: Secondary | ICD-10-CM | POA: Diagnosis present

## 2017-09-09 DIAGNOSIS — R0789 Other chest pain: Secondary | ICD-10-CM

## 2017-09-09 LAB — BASIC METABOLIC PANEL
Anion gap: 11 (ref 5–15)
BUN: 12 mg/dL (ref 6–20)
CALCIUM: 9.5 mg/dL (ref 8.9–10.3)
CO2: 25 mmol/L (ref 22–32)
Chloride: 104 mmol/L (ref 101–111)
Creatinine, Ser: 1 mg/dL (ref 0.61–1.24)
GFR calc non Af Amer: >60 mL/min (ref >60)
Glucose, Bld: 99 mg/dL (ref 65–99)
Potassium: 3.3 mmol/L — ABNORMAL LOW (ref 3.5–5.1)
SODIUM: 140 mmol/L (ref 135–145)

## 2017-09-09 LAB — I-STAT TROPONIN, ED: TROPONIN I, POC: 0 ng/mL (ref 0.00–0.08)

## 2017-09-09 LAB — CBC
HCT: 39.2 % (ref 39.0–52.0)
HEMOGLOBIN: 13.4 g/dL (ref 13.0–17.0)
MCH: 28.2 pg (ref 26.0–34.0)
MCHC: 34.2 g/dL (ref 30.0–36.0)
MCV: 82.4 fL (ref 78.0–100.0)
PLATELETS: 212 10*3/uL (ref 150–400)
RBC: 4.76 MIL/uL (ref 4.22–5.81)
RDW: 14.4 % (ref 11.5–15.5)
WBC: 11 10*3/uL — AB (ref 4.0–10.5)

## 2017-09-09 MED ORDER — KETOROLAC TROMETHAMINE 30 MG/ML IJ SOLN
30.0000 mg | Freq: Once | INTRAMUSCULAR | Status: AC
Start: 2017-09-09 — End: 2017-09-09
  Administered 2017-09-09: 30 mg via INTRAVENOUS

## 2017-09-09 MED ORDER — KETOROLAC TROMETHAMINE 30 MG/ML IJ SOLN
60.0000 mg | Freq: Once | INTRAMUSCULAR | Status: DC
  Filled 2017-09-09: qty 2

## 2017-09-09 MED ORDER — NAPROXEN 500 MG PO TABS: 500.0000 mg | ORAL_TABLET | Freq: Two times a day (BID) | ORAL | 0 refills | Status: AC

## 2017-09-09 NOTE — Discharge Instructions (Signed)
As discussed, your evaluation today has been largely reassuring.  But, it is important that you monitor your condition carefully, and do not hesitate to return to the ED if you develop new, or concerning changes in your condition.  You may use the prescribed Naprosyn, or inappropriate over-the-counter alternative, such as ibuprofen, 400 mg, 3 times daily for the next 5 days.   Otherwise, please follow-up with your physician for appropriate ongoing care.

## 2017-09-09 NOTE — ED Triage Notes (Addendum)
C/o constant, sharp pain to center of back that radiates to chest with sob since earlier tonight.  Denies nausea and vomiting.  Reports marijuana use tonight after pain started.

## 2017-09-09 NOTE — ED Provider Notes (Signed)
[facility]   CSN: [id number] Arrival date & time: 09/09/17  0224     History   Chief Complaint Chief Complaint  Patient presents with  . Chest Pain  . Back Pain    HPI Roberto Goodman is a 37 y.o. male.  HPI Patient presents with chest pain. Onset was about 10 hours ago. Patient felt the sudden sharp discomfort that began after either sneezing or coughing. He is unsure of which one. Since onset patient has had severe sharp pain in the xiphoid and mid thoracic area posteriorly. Pain is sharp, worse with inspiration, nonradiating. There is no new weakness in either extremity, no cough, no fever, no chills. Patient does not smoke, but states that he is generally well, denies history of substantial medical disease. He does have a history of cervical spine surgery in the distant past. History reviewed. No pertinent past medical history.  Patient Active Problem List   Diagnosis Date Noted  . Hypokalemia 03/23/2016  . Fever 03/21/2016  . Generalized abdominal pain 03/21/2016  . SIRS (systemic inflammatory response syndrome) (HCC) 03/21/2016  . Lactic acidosis 03/21/2016  . UTI (urinary tract infection) 12/28/2012    Past Surgical History:  Procedure Laterality Date  . BACK SURGERY    . CERVICAL FUSION         Home Medications    Prior to Admission medications   Medication Sig Start Date End Date Taking? Authorizing Provider  dicyclomine (BENTYL) 20 MG tablet Take 1 tablet (20 mg total) by mouth 2 (two) times daily. Patient not taking: Reported on 09/09/2017 05/29/17   Garlon HatchetSanders, Lisa M, PA-C  ondansetron (ZOFRAN ODT) 4 MG disintegrating tablet Take 1 tablet (4 mg total) by mouth every 8 (eight) hours as needed for nausea. Patient not taking: Reported on 09/09/2017 05/29/17   Garlon HatchetSanders, Lisa M, PA-C    Family History No family history on file.  Social History Social History   Tobacco Use  . Smoking status:  Current Every Day Smoker  . Smokeless tobacco: Never Used  Substance Use Topics  . Alcohol use: Yes  . Drug use: Yes    Types: Marijuana     Allergies   Patient has no known allergies.   Review of Systems Review of Systems  Constitutional:       Per HPI, otherwise negative  HENT:       Per HPI, otherwise negative  Respiratory:       Former smoker, quit about 6 years ago  Cardiovascular:       Per HPI, otherwise negative  Gastrointestinal: Negative for vomiting.  Endocrine:       Negative aside from HPI  Genitourinary:       Neg aside from HPI   Musculoskeletal:       Per HPI, otherwise negative  Skin: Negative.   Neurological: Negative for syncope.     Physical Exam Updated Vital Signs BP 129/83 (BP Location: Right Arm)   Pulse 73   Temp (!) 97.5 F (36.4 C) (Oral)   Resp 18   Ht 5\' 7"  (1.702 m)   Wt 60.3 kg (133 lb)   SpO2 100%   BMI 20.83 kg/m   Physical Exam  Constitutional: He is oriented to person, place, and time. He appears well-developed. No distress.  HENT:  Head: Normocephalic and atraumatic.  Eyes: Conjunctivae and EOM are normal.  Cardiovascular: Normal rate, regular rhythm and intact distal pulses.  Pulmonary/Chest: Effort normal. No stridor.  No respiratory distress.  Tenderness to palpation about the xiphoid and lower sternum  Abdominal: He exhibits no distension.  Musculoskeletal: He exhibits no edema.  Neurological: He is alert and oriented to person, place, and time.  Skin: Skin is warm and dry.  Psychiatric: He has a normal mood and affect.  Nursing note and vitals reviewed.    ED Treatments / Results  Labs (all labs ordered are listed, but only abnormal results are displayed) Labs Reviewed  BASIC METABOLIC PANEL - Abnormal; Notable for the following components:      Result Value   Potassium 3.3 (*)    All other components within normal limits  CBC - Abnormal; Notable for the following components:   WBC 11.0 (*)    All other  components within normal limits  I-STAT TROPONIN, ED    EKG  EKG Interpretation  Date/Time:  Saturday September 09 2017 02:34:55 EST Ventricular Rate:  72 PR Interval:  134 QRS Duration: 80 QT Interval:  372 QTC Calculation: 407 R Axis:   69 Text Interpretation:  Normal sinus rhythm with sinus arrhythmia Artifact No significant change since last tracing Abnormal ekg Confirmed by Gerhard Munch [phone]) on 09/09/2017 7:07:02 AM       Radiology Dg Chest 2 View  Result Date: 09/09/2017 CLINICAL DATA:  Chest pain EXAM: CHEST  2 VIEW COMPARISON:  03/21/2016 FINDINGS: The heart size and mediastinal contours are within normal limits. Both lungs are clear. The visualized skeletal structures are unremarkable. IMPRESSION: No active cardiopulmonary disease. Electronically Signed   By: Jasmine Pang M.D.   On: 09/09/2017 03:39   Ct Chest Wo Contrast  Result Date: 09/09/2017 CLINICAL DATA:  Pain to center of back that radiates into the chest. Shortness of breath. EXAM: CT CHEST WITHOUT CONTRAST TECHNIQUE: Multidetector CT imaging of the chest was performed following the standard protocol without IV contrast. COMPARISON:  Chest x-ray September 09, 2017 FINDINGS: Cardiovascular: No significant vascular findings. Normal heart size. No pericardial effusion. Mediastinum/Nodes: No enlarged mediastinal or axillary lymph nodes. Thyroid gland, trachea, and esophagus demonstrate no significant findings. Lungs/Pleura: The central airways are normal. No pneumothorax. Dependent atelectasis is seen in the lungs, left greater than right. There is a nodule in the superior segment of the left lower lobe on series 4, image 72 measuring 7.6 mm. There is a 3 mm nodule in the right lung on series 4, image 103. No suspicious masses or infiltrates. Upper Abdomen: No acute abnormality. Musculoskeletal: Minimal degenerative changes in the thoracic spine. Surgical changes to the lower cervical spine. No other bony abnormalities.  IMPRESSION: 1. There is a 7.6 mm nodule in the left lower lobe and a 3 mm nodule in the right lung. Non-contrast chest CT at 6-12 months is recommended. If the nodule is stable at time of repeat CT, then future CT at 18-24 months (from today's scan) is considered optional for low-risk patients, but is recommended for high-risk patients. This recommendation follows the consensus statement: Guidelines for Management of Incidental Pulmonary Nodules Detected on CT Images: From the Fleischner Society 2017; Radiology 2017; 284:228-243. 2. Mild degenerative changes in the thoracic spine. No other bony abnormalities. Electronically Signed   By: Gerome Sam III M.D   On: 09/09/2017 08:56    Procedures Procedures (including critical care time)  Medications Ordered in ED Medications  ketorolac (TORADOL) 30 MG/ML injection 30 mg (30 mg Intravenous Given 09/09/17 0830)     Initial Impression / Assessment and Plan / ED Course  I  have reviewed the triage vital signs and the nursing notes.  Pertinent labs & imaging results that were available during my care of the patient were reviewed by me and considered in my medical decision making (see chart for details).     9:16 AM Patient in no distress, stating that he feels better. He has received Toradol. CT scan demonstrated nodules, otherwise no acute findings. I discussed the significance of nodules with the patient, and he agrees to pursue follow-up in about 1 year for repeat CT scan.  This generally well-appearing young male with a history of smoking in the distant past presents with new chest pain. Here he is awake, alert, appropriately oriented, in no distress, with unremarkable physical exam, including bilateral symmetric upper extremity pulses, and no evidence for hypoxia, and no tachypnea, tachycardia suggesting PE, or vascular compromise. Patient is improvement with Toradol suggests likely musculoskeletal etiology, consistent with patient's  description of onset of symptoms after possible sneeze or cough yesterday. No evidence for rib fracture. Given patient's improvement, reassuring vital signs, he was discharged in stable condition.  Final Clinical Impressions(s) / ED Diagnoses  Atypical chest pain   Gerhard Munch, MD 09/09/17 [phone]

## 2017-09-09 NOTE — ED Notes (Signed)
Dr.Lockwood at bedside  

## 2017-09-15 ENCOUNTER — Emergency Department (HOSPITAL_COMMUNITY)
Admission: EM | Admit: 2017-09-15 | Discharge: 2017-09-15 | Disposition: A | Discharge status: Home / Self Care | Payer: BLUE CROSS/BLUE SHIELD | Attending: Emergency Medicine | Admitting: Emergency Medicine

## 2017-09-15 ENCOUNTER — Emergency Department (HOSPITAL_COMMUNITY): Payer: BLUE CROSS/BLUE SHIELD

## 2017-09-15 ENCOUNTER — Other Ambulatory Visit: Payer: Self-pay

## 2017-09-15 ENCOUNTER — Encounter (HOSPITAL_COMMUNITY): Payer: Self-pay

## 2017-09-15 DIAGNOSIS — Y939 Activity, unspecified: Secondary | ICD-10-CM | POA: Diagnosis not present

## 2017-09-15 DIAGNOSIS — Z87891 Personal history of nicotine dependence: Secondary | ICD-10-CM | POA: Insufficient documentation

## 2017-09-15 DIAGNOSIS — S161XXA Strain of muscle, fascia and tendon at neck level, initial encounter: Secondary | ICD-10-CM | POA: Diagnosis not present

## 2017-09-15 DIAGNOSIS — Z041 Encounter for examination and observation following transport accident: Secondary | ICD-10-CM | POA: Diagnosis present

## 2017-09-15 DIAGNOSIS — M79602 Pain in left arm: Secondary | ICD-10-CM | POA: Diagnosis not present

## 2017-09-15 DIAGNOSIS — Y929 Unspecified place or not applicable: Secondary | ICD-10-CM | POA: Diagnosis not present

## 2017-09-15 DIAGNOSIS — Y999 Unspecified external cause status: Secondary | ICD-10-CM | POA: Diagnosis not present

## 2017-09-15 MED ORDER — HYDROCODONE-ACETAMINOPHEN 5-325 MG PO TABS
1.0000 | ORAL_TABLET | Freq: Once | ORAL | Status: AC
  Administered 2017-09-15: 1 via ORAL
  Filled 2017-09-15: qty 1

## 2017-09-15 MED ORDER — DICLOFENAC SODIUM 50 MG PO TBEC: 50.0000 mg | DELAYED_RELEASE_TABLET | Freq: Two times a day (BID) | ORAL | 0 refills | Status: DC

## 2017-09-15 MED ORDER — CYCLOBENZAPRINE HCL 10 MG PO TABS: 10.0000 mg | ORAL_TABLET | Freq: Two times a day (BID) | ORAL | 0 refills | Status: DC | PRN

## 2017-09-15 MED ORDER — CYCLOBENZAPRINE HCL 10 MG PO TABS
10.0000 mg | ORAL_TABLET | Freq: Once | ORAL | Status: AC
  Administered 2017-09-15: 10 mg via ORAL
  Filled 2017-09-15: qty 1

## 2017-09-15 NOTE — ED Notes (Signed)
Pt in xray
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
## 2017-09-15 NOTE — Discharge Instructions (Signed)
Do not drive while taking the muscle relaxer as it will make you sleepy. °

## 2017-11-10 ENCOUNTER — Emergency Department (HOSPITAL_COMMUNITY): Payer: BLUE CROSS/BLUE SHIELD

## 2017-11-10 ENCOUNTER — Other Ambulatory Visit: Payer: Self-pay

## 2017-11-10 ENCOUNTER — Emergency Department (HOSPITAL_COMMUNITY)
Admission: EM | Admit: 2017-11-10 | Discharge: 2017-11-10 | Disposition: A | Discharge status: Home / Self Care | Payer: BLUE CROSS/BLUE SHIELD | Attending: Physician Assistant | Admitting: Physician Assistant

## 2017-11-10 ENCOUNTER — Encounter (HOSPITAL_COMMUNITY): Payer: Self-pay

## 2017-11-10 DIAGNOSIS — S161XXA Strain of muscle, fascia and tendon at neck level, initial encounter: Secondary | ICD-10-CM | POA: Insufficient documentation

## 2017-11-10 DIAGNOSIS — Y999 Unspecified external cause status: Secondary | ICD-10-CM | POA: Diagnosis not present

## 2017-11-10 DIAGNOSIS — S199XXA Unspecified injury of neck, initial encounter: Secondary | ICD-10-CM | POA: Diagnosis present

## 2017-11-10 DIAGNOSIS — Z87891 Personal history of nicotine dependence: Secondary | ICD-10-CM | POA: Diagnosis not present

## 2017-11-10 DIAGNOSIS — Y929 Unspecified place or not applicable: Secondary | ICD-10-CM | POA: Insufficient documentation

## 2017-11-10 DIAGNOSIS — Y939 Activity, unspecified: Secondary | ICD-10-CM | POA: Diagnosis not present

## 2017-11-10 MED ORDER — METHOCARBAMOL 500 MG PO TABS: 500.0000 mg | ORAL_TABLET | Freq: Two times a day (BID) | ORAL | 0 refills | Status: DC

## 2017-11-10 MED ORDER — NAPROXEN 500 MG PO TABS: 500.0000 mg | ORAL_TABLET | Freq: Two times a day (BID) | ORAL | 0 refills | Status: DC

## 2017-11-10 NOTE — ED Notes (Signed)
ED Provider at bedside. 
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
## 2017-11-10 NOTE — ED Notes (Signed)
Pt ambulatory to treatment room, nad noted

## 2017-11-10 NOTE — Discharge Instructions (Signed)
Do not drive while taking the muscle relaxer as it will make you sleepy. °

## 2017-11-10 NOTE — ED Notes (Signed)
Patient transported to X-ray 

## 2019-01-24 ENCOUNTER — Encounter (HOSPITAL_BASED_OUTPATIENT_CLINIC_OR_DEPARTMENT_OTHER): Payer: Self-pay | Admitting: *Deleted

## 2019-01-24 ENCOUNTER — Emergency Department (HOSPITAL_BASED_OUTPATIENT_CLINIC_OR_DEPARTMENT_OTHER): Payer: BLUE CROSS/BLUE SHIELD

## 2019-01-24 ENCOUNTER — Emergency Department (HOSPITAL_BASED_OUTPATIENT_CLINIC_OR_DEPARTMENT_OTHER)
Admission: EM | Admit: 2019-01-24 | Discharge: 2019-01-24 | Disposition: A | Discharge status: Home / Self Care | Payer: BLUE CROSS/BLUE SHIELD | Attending: Emergency Medicine | Admitting: Emergency Medicine

## 2019-01-24 ENCOUNTER — Other Ambulatory Visit: Payer: Self-pay

## 2019-01-24 DIAGNOSIS — Y9389 Activity, other specified: Secondary | ICD-10-CM | POA: Diagnosis not present

## 2019-01-24 DIAGNOSIS — Y92410 Unspecified street and highway as the place of occurrence of the external cause: Secondary | ICD-10-CM | POA: Insufficient documentation

## 2019-01-24 DIAGNOSIS — S39012A Strain of muscle, fascia and tendon of lower back, initial encounter: Secondary | ICD-10-CM | POA: Diagnosis not present

## 2019-01-24 DIAGNOSIS — Z87891 Personal history of nicotine dependence: Secondary | ICD-10-CM | POA: Insufficient documentation

## 2019-01-24 DIAGNOSIS — Y998 Other external cause status: Secondary | ICD-10-CM | POA: Insufficient documentation

## 2019-01-24 DIAGNOSIS — S199XXA Unspecified injury of neck, initial encounter: Secondary | ICD-10-CM | POA: Diagnosis present

## 2019-01-24 DIAGNOSIS — S161XXA Strain of muscle, fascia and tendon at neck level, initial encounter: Secondary | ICD-10-CM

## 2019-01-24 MED ORDER — NAPROXEN 500 MG PO TABS: 500.0000 mg | ORAL_TABLET | Freq: Two times a day (BID) | ORAL | 0 refills | Status: DC | PRN

## 2019-01-24 MED ORDER — METHOCARBAMOL 500 MG PO TABS: 500.0000 mg | ORAL_TABLET | Freq: Two times a day (BID) | ORAL | 0 refills | Status: DC | PRN

## 2019-01-24 MED ORDER — OXYCODONE-ACETAMINOPHEN 5-325 MG PO TABS
1.0000 | ORAL_TABLET | Freq: Once | ORAL | Status: AC
  Administered 2019-01-24: 1 via ORAL
  Filled 2019-01-24: qty 1

## 2019-01-24 NOTE — Discharge Instructions (Signed)
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
## 2019-01-24 NOTE — ED Notes (Signed)
ED Provider at bedside discussing the test results and dispo plan of care.  C-Collar removed by edp.
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
## 2020-10-19 ENCOUNTER — Emergency Department (HOSPITAL_BASED_OUTPATIENT_CLINIC_OR_DEPARTMENT_OTHER): Payer: 59

## 2020-10-19 ENCOUNTER — Encounter (HOSPITAL_BASED_OUTPATIENT_CLINIC_OR_DEPARTMENT_OTHER): Payer: Self-pay | Admitting: Obstetrics and Gynecology

## 2020-10-19 ENCOUNTER — Other Ambulatory Visit: Payer: Self-pay

## 2020-10-19 ENCOUNTER — Emergency Department (HOSPITAL_BASED_OUTPATIENT_CLINIC_OR_DEPARTMENT_OTHER)
Admission: EM | Admit: 2020-10-19 | Discharge: 2020-10-19 | Disposition: A | Discharge status: Home / Self Care | Payer: 59 | Attending: Emergency Medicine | Admitting: Emergency Medicine

## 2020-10-19 DIAGNOSIS — Z23 Encounter for immunization: Secondary | ICD-10-CM | POA: Diagnosis not present

## 2020-10-19 DIAGNOSIS — Y929 Unspecified place or not applicable: Secondary | ICD-10-CM | POA: Insufficient documentation

## 2020-10-19 DIAGNOSIS — Y990 Civilian activity done for income or pay: Secondary | ICD-10-CM | POA: Insufficient documentation

## 2020-10-19 DIAGNOSIS — Y9389 Activity, other specified: Secondary | ICD-10-CM | POA: Insufficient documentation

## 2020-10-19 DIAGNOSIS — Z87891 Personal history of nicotine dependence: Secondary | ICD-10-CM | POA: Insufficient documentation

## 2020-10-19 DIAGNOSIS — S80872A Other superficial bite, left lower leg, initial encounter: Secondary | ICD-10-CM | POA: Insufficient documentation

## 2020-10-19 DIAGNOSIS — W540XXA Bitten by dog, initial encounter: Secondary | ICD-10-CM | POA: Insufficient documentation

## 2020-10-19 DIAGNOSIS — S8992XA Unspecified injury of left lower leg, initial encounter: Secondary | ICD-10-CM | POA: Diagnosis present

## 2020-10-19 MED ORDER — TETANUS-DIPHTH-ACELL PERTUSSIS 5-2.5-18.5 LF-MCG/0.5 IM SUSY
0.5000 mL | PREFILLED_SYRINGE | Freq: Once | INTRAMUSCULAR | Status: AC
  Administered 2020-10-19: 0.5 mL via INTRAMUSCULAR
  Filled 2020-10-19: qty 0.5
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
## 2020-10-19 NOTE — Discharge Instructions (Addendum)
Please return to the ER for any signs of infection. If you find out the dog was not vaccinated or do not have a definitive answer regarding rabies vaccinations in the next 72 hours please return to the ER to begin your Rabies vaccine series.

## 2020-10-19 NOTE — ED Triage Notes (Signed)
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 Patient reports the owner stated the dog was up to date on his dog. Patient reports the dog bit him on the back of the left ankle/leg region. Bleeding controlled at this time.

## 2020-10-19 NOTE — ED Notes (Signed)
No signature pad.  Patient verbalized understanding.

## 2022-08-25 ENCOUNTER — Encounter (HOSPITAL_COMMUNITY): Payer: Self-pay

## 2022-08-25 ENCOUNTER — Ambulatory Visit (HOSPITAL_COMMUNITY)
Admission: RE | Admit: 2022-08-25 | Discharge: 2022-08-25 | Disposition: A | Discharge status: Home / Self Care | Payer: BC Managed Care – PPO | Source: Ambulatory Visit | Attending: Nurse Practitioner | Admitting: Nurse Practitioner

## 2022-08-25 VITALS — BP 119/75 | HR 74 | Temp 98.2°F | Resp 16 | Ht 67.0 in | Wt 143.2 lb

## 2022-08-25 DIAGNOSIS — Z202 Contact with and (suspected) exposure to infections with a predominantly sexual mode of transmission: Secondary | ICD-10-CM | POA: Insufficient documentation

## 2022-08-25 MED ORDER — METRONIDAZOLE 500 MG PO TABS: 500.0000 mg | ORAL_TABLET | Freq: Two times a day (BID) | ORAL | 0 refills | Status: AC

## 2022-08-25 NOTE — Discharge Instructions (Signed)
Metronidazole twice daily for 7 days The clinic will contact you if any results of your testing done today are positive follow-up with your PCP as needed

## 2022-08-25 NOTE — ED Triage Notes (Signed)
Chief Complaint: STD testing for Trich. States partner tested positive but not having any symptoms.

## 2022-08-25 NOTE — ED Provider Notes (Signed)
[facility]    CSN: [id number] Arrival date & time: 08/25/22  1844      History   Chief Complaint Chief Complaint  Patient presents with   Exposure to STD    HPI Roberto Goodman is a 42 y.o. male presents for evaluation of STD testing/exposure.  Patient reports he was informed he was exposed to trichomonas.  He currently denies any symptoms including dysuria, penile discharge, testicular pain or swelling, fever or chills.  Denies being treated for trichomonas in the past.  He has no other concerns at this time.   Exposure to STD    History reviewed. No pertinent past medical history.  Patient Active Problem List   Diagnosis Date Noted   Hypokalemia 03/23/2016   Fever 03/21/2016   Generalized abdominal pain 03/21/2016   SIRS (systemic inflammatory response syndrome) ([place]) 03/21/2016   Lactic acidosis 03/21/2016   UTI (urinary tract infection) 12/28/2012    Past Surgical History:  Procedure Laterality Date   BACK SURGERY     CERVICAL FUSION         Home Medications    Prior to Admission medications   Medication Sig Start Date End Date Taking? Authorizing Provider  metroNIDAZOLE (FLAGYL) 500 MG tablet Take 1 tablet (500 mg total) by mouth 2 (two) times daily for 7 days. 08/25/22 09/01/22 Yes Melynda Ripple, NP  methocarbamol (ROBAXIN) 500 MG tablet Take 1 tablet (500 mg total) by mouth 2 (two) times daily as needed for muscle spasms. 01/24/19   Ward, Ozella Almond, PA-C  naproxen (NAPROSYN) 500 MG tablet Take 1 tablet (500 mg total) by mouth 2 (two) times daily as needed for mild pain or moderate pain. 01/24/19   Ward, Ozella Almond, PA-C    Family History History reviewed. No pertinent family history.  Social History Social History   Tobacco Use   Smoking status: Former    Types: Cigarettes    Quit date: 2013    Years since quitting: 11.0   Smokeless tobacco: Never  Vaping Use   Vaping Use: Never used  Substance Use Topics   Alcohol use: Yes    Drug use: Not Currently    Types: Marijuana    Comment: daily use     Allergies   Patient has no known allergies.   Review of Systems Review of Systems  Constitutional:        Exposure to trichomonas     Physical Exam Triage Vital Signs ED Triage Vitals  Enc Vitals Group     BP 08/25/22 1854 119/75     Pulse Rate 08/25/22 1854 74     Resp 08/25/22 1854 16     Temp 08/25/22 1854 98.2 F (36.8 C)     Temp Source 08/25/22 1854 Oral     SpO2 08/25/22 1854 95 %     Weight 08/25/22 1853 143 lb 3.2 oz (65 kg)     Height 08/25/22 1853 5\' 7"  (1.702 m)     Head Circumference --      Peak Flow --      Pain Score 08/25/22 1853 0     Pain Loc --      Pain Edu? --      Excl. in [place]? --    No data found.  Updated Vital Signs BP 119/75 (BP Location: Right Arm)   Pulse 74   Temp 98.2 F (36.8 C) (Oral)   Resp 16   Ht 5\' 7"  (1.702 m)   Wt  143 lb 3.2 oz (65 kg)   SpO2 95%   BMI 22.43 kg/m   Visual Acuity Right Eye Distance:   Left Eye Distance:   Bilateral Distance:    Right Eye Near:   Left Eye Near:    Bilateral Near:     Physical Exam Vitals and nursing note reviewed.  Constitutional:      Appearance: Normal appearance.  HENT:     Head: Normocephalic and atraumatic.  Eyes:     Pupils: Pupils are equal, round, and reactive to light.  Cardiovascular:     Rate and Rhythm: Normal rate.  Pulmonary:     Effort: Pulmonary effort is normal.  Skin:    General: Skin is warm and dry.  Neurological:     General: No focal deficit present.     Mental Status: He is alert and oriented to person, place, and time.  Psychiatric:        Mood and Affect: Mood normal.        Behavior: Behavior normal.      UC Treatments / Results  Labs (all labs ordered are listed, but only abnormal results are displayed) Labs Reviewed  CYTOLOGY, (ORAL, ANAL, URETHRAL) ANCILLARY ONLY    EKG   Radiology No results found.  Procedures Procedures (including critical care  time)  Medications Ordered in UC Medications - No data to display  Initial Impression / Assessment and Plan / UC Course  I have reviewed the triage vital signs and the nursing notes.  Pertinent labs & imaging results that were available during my care of the patient were reviewed by me and considered in my medical decision making (see chart for details).     STD testing is ordered.  Patient declined blood work. Start metronidazole given exposure to trichomonas Follow-up with PCP as needed  Final Clinical Impressions(s) / UC Diagnoses   Final diagnoses:  Exposure to trichomonas     Discharge Instructions      Metronidazole twice daily for 7 days The clinic will contact you if any results of your testing done today are positive follow-up with your PCP as needed   ED Prescriptions     Medication Sig Dispense Auth. Provider   metroNIDAZOLE (FLAGYL) 500 MG tablet Take 1 tablet (500 mg total) by mouth 2 (two) times daily for 7 days. 14 tablet Melynda Ripple, NP      PDMP not reviewed this encounter.   Melynda Ripple, NP 08/25/22 [phone]

## 2022-08-26 LAB — CYTOLOGY, (ORAL, ANAL, URETHRAL) ANCILLARY ONLY
Chlamydia: NEGATIVE
Comment: NEGATIVE
Comment: NEGATIVE
Comment: NORMAL
Neisseria Gonorrhea: NEGATIVE
Trichomonas: POSITIVE — AB

## 2023-01-17 IMAGING — DX DG TIBIA/FIBULA 2V*L*
1 series · 2 of 2 positions shown · non-contrast
Comparison: None.

CLINICAL DATA: Status post dog bite to the left calf.

EXAM:
LEFT TIBIA AND FIBULA - 2 VIEW

[Series 1: leg · 0.14mm/px · 2 of 2 slices shown]
[im 1/2]
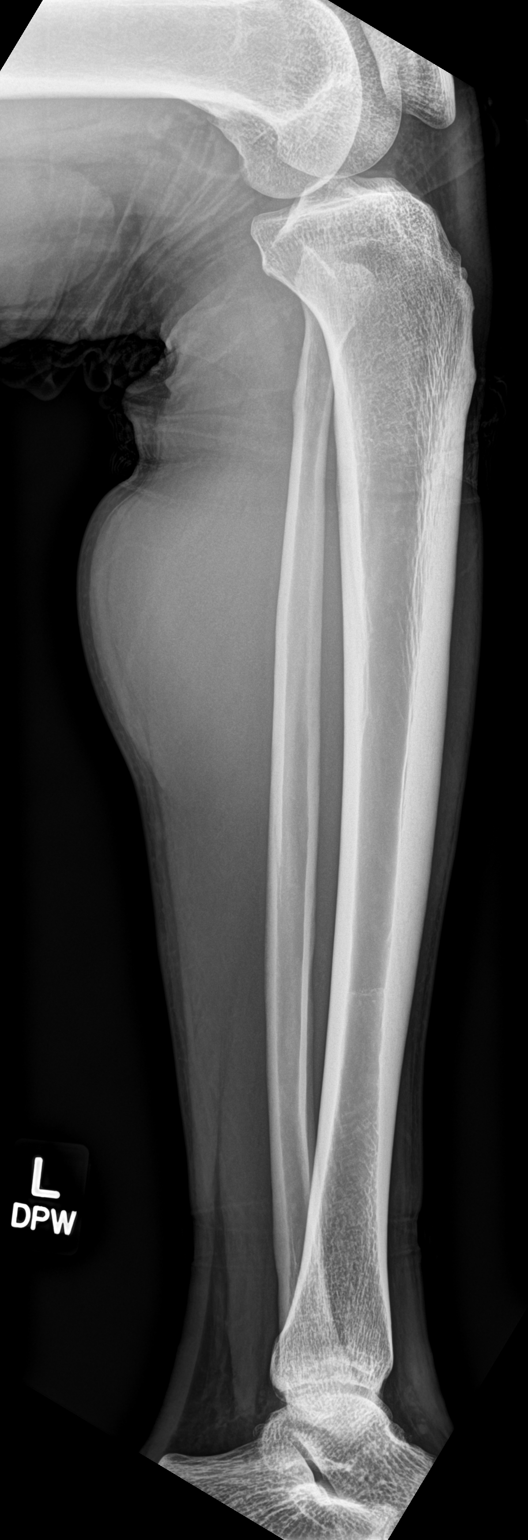
[im 2/2]
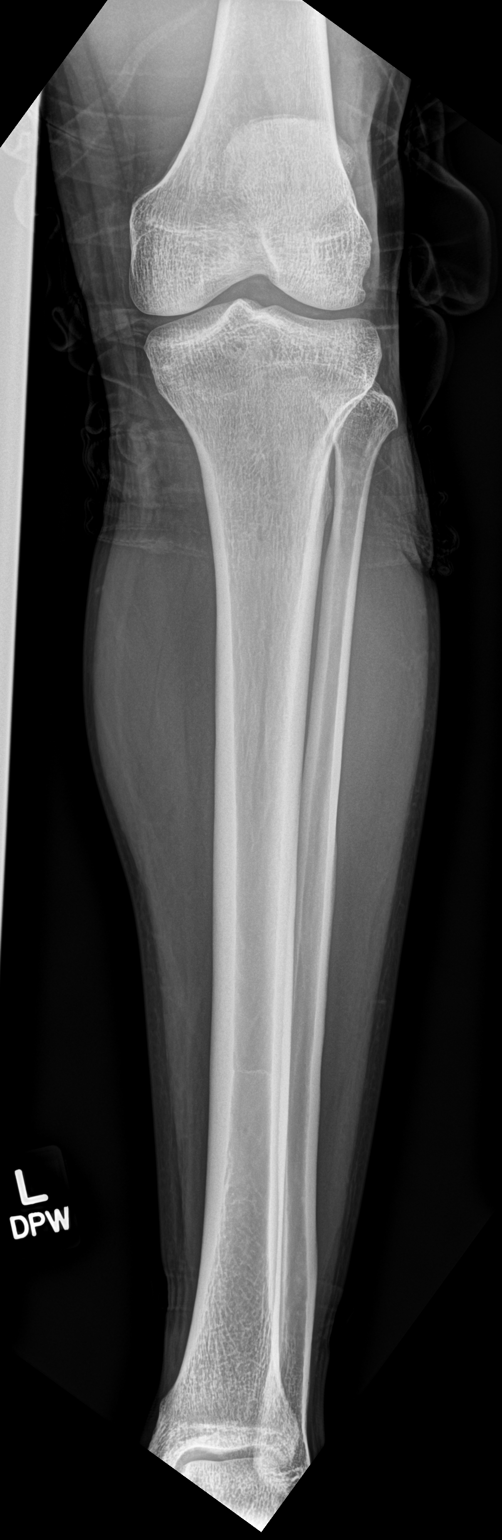

[2 of 2 positions shown; findings below may reference images not displayed]

FINDINGS: There is no evidence of fracture or other focal bone lesions. Soft
tissues are unremarkable. No radiopaque soft tissue foreign bodies
are seen.
IMPRESSION: Negative.

## 2023-05-29 ENCOUNTER — Encounter (HOSPITAL_BASED_OUTPATIENT_CLINIC_OR_DEPARTMENT_OTHER): Payer: Self-pay

## 2023-05-29 ENCOUNTER — Other Ambulatory Visit: Payer: Self-pay

## 2023-05-29 ENCOUNTER — Emergency Department (HOSPITAL_BASED_OUTPATIENT_CLINIC_OR_DEPARTMENT_OTHER)
Admission: EM | Admit: 2023-05-29 | Discharge: 2023-05-29 | Disposition: A | Discharge status: Home / Self Care | Payer: BC Managed Care – PPO | Attending: Emergency Medicine | Admitting: Emergency Medicine

## 2023-05-29 DIAGNOSIS — M5441 Lumbago with sciatica, right side: Secondary | ICD-10-CM | POA: Insufficient documentation

## 2023-05-29 DIAGNOSIS — M545 Low back pain, unspecified: Secondary | ICD-10-CM | POA: Diagnosis present

## 2023-05-29 MED ORDER — KETOROLAC TROMETHAMINE 15 MG/ML IJ SOLN
15.0000 mg | Freq: Once | INTRAMUSCULAR | Status: DC
  Filled 2023-05-29: qty 1

## 2023-05-29 MED ORDER — NAPROXEN 500 MG PO TABS: 500.0000 mg | ORAL_TABLET | Freq: Two times a day (BID) | ORAL | 0 refills | Status: DC | PRN

## 2023-05-29 MED ORDER — KETOROLAC TROMETHAMINE 15 MG/ML IJ SOLN
15.0000 mg | Freq: Once | INTRAMUSCULAR | Status: AC
  Administered 2023-05-29: 15 mg via INTRAMUSCULAR

## 2023-05-29 MED ORDER — METHOCARBAMOL 500 MG PO TABS: 500.0000 mg | ORAL_TABLET | Freq: Two times a day (BID) | ORAL | 0 refills | Status: DC | PRN

## 2023-05-29 NOTE — ED Provider Notes (Signed)
[facility] [facility] Provider Note   CSN: [id number] Arrival date & time: 05/29/23  1848     History  Chief Complaint  Patient presents with   Back Pain    Roberto Goodman is a 42 y.o. male.  PMH of degenerative disc disease of cervical spine with prior fusion.  Presents the ER with low back pain for the past week is gradually getting worse, states pain with certain movements radiates to the right posterior thigh.  No leg pain or swelling aside from with certain movements.  Denies numbness tingling or weakness feeling no saddle anesthesia or paresthesia, no bowel or bladder incontinence no unintentional weight loss.  Denies trauma to the back.  He thinks it initially started when he was reaching for something in the shower but he is not positive.  Patient denies history of diabetes, no immune suppressing drugs, he does not use substances specifically no IV drugs.  He has not had any fevers.  No urinary symptoms,   Back Pain      Home Medications Prior to Admission medications   Medication Sig Start Date End Date Taking? Authorizing Provider  methocarbamol (ROBAXIN) 500 MG tablet Take 1 tablet (500 mg total) by mouth 2 (two) times daily as needed for muscle spasms. 01/24/19   Ward, Chase Picket, PA-C  naproxen (NAPROSYN) 500 MG tablet Take 1 tablet (500 mg total) by mouth 2 (two) times daily as needed for mild pain or moderate pain. 01/24/19   Ward, Chase Picket, PA-C      Allergies    Patient has no known allergies.    Review of Systems   Review of Systems  Musculoskeletal:  Positive for back pain.    Physical Exam Updated Vital Signs BP 106/72 (BP Location: Left Arm)   Pulse 69   Temp 98.3 F (36.8 C) (Oral)   Resp 18   Ht 5\' 7"  (1.702 m)   Wt 61.2 kg   SpO2 100%   BMI 21.14 kg/m  Physical Exam Vitals and nursing note reviewed.  Constitutional:      General: He is not in acute distress.    Appearance: He is  well-developed.  HENT:     Head: Normocephalic and atraumatic.  Eyes:     Conjunctiva/sclera: Conjunctivae normal.  Cardiovascular:     Rate and Rhythm: Normal rate and regular rhythm.     Heart sounds: No murmur heard. Pulmonary:     Effort: Pulmonary effort is normal. No respiratory distress.     Breath sounds: Normal breath sounds.  Abdominal:     Palpations: Abdomen is soft.     Tenderness: There is no abdominal tenderness.  Musculoskeletal:        General: No swelling.     Cervical back: Normal and neck supple.     Thoracic back: Normal.     Lumbar back: No swelling, deformity, spasms, tenderness or bony tenderness. Normal range of motion.     Comments: Diffuse tenderness lumbar area no swelling or crepitus.  Positive straight leg raise on the right  Skin:    General: Skin is warm and dry.     Capillary Refill: Capillary refill takes less than 2 seconds.  Neurological:     Mental Status: He is alert and oriented to person, place, and time.     Motor: Motor function is intact. No weakness or atrophy.     Gait: Gait normal.     Deep Tendon Reflexes:  Reflex Scores:      Patellar reflexes are 2+ on the right side and 2+ on the left side.      Achilles reflexes are 2+ on the right side and 2+ on the left side.    Comments: Patient has full strength on flexion extension at hips knees and ankles.  Psychiatric:        Mood and Affect: Mood normal.     ED Results / Procedures / Treatments   Labs (all labs ordered are listed, but only abnormal results are displayed) Labs Reviewed - No data to display  EKG None  Radiology No results found.  Procedures Procedures    Medications Ordered in ED Medications  ketorolac (TORADOL) 15 MG/ML injection 15 mg (15 mg Intramuscular Given 05/29/23 2027)    ED Course/ Medical Decision Making/ A&P                                 Medical Decision Making This patient presents to the ED for concern of back pain this involves  an extensive number of treatment options, and is a complaint that carries with it a high risk of complications and morbidity.  The differential diagnosis includes sprain, strain, HNP, fracture, DDD, muscle spasm, cauda equina, epidural abscess or hematoma, malignancy, other    Additional history obtained:  Additional history obtained from EMR External records from outside source obtained and reviewed including notes   Problem List / ED Course / Critical interventions / Medication management  Patient presents with low back pain is 1 week that occasionally radiates down right leg . Differential considered as above. Symptoms are likely due to generative disc disease versus muscle strain versus muscle spasm. They have no high risk features including saddle anesthesia/paresthesia, bowel or bladder incontinence, urinary retention, fever, weight loss, history of cancer or immune suppression, or IV drug use.  Have extremely low suspicion for epidural abscess, cauda equina or other more sinister cause.  We discussed plan to treat symptoms with NSAIDs, and follow up with [facility] care. They were advised on strict return precautions.  I offered x-ray imaging when patient inquired about it though we discussed at length that he had no trauma and is having no red flag symptoms it would potentially show some degenerative changes but would be relatively unlikely to change management given his very reassuring exam and history.  He would prefer to not have any imaging today and is going to follow-up closely with his doctor.  He is already had surgery for degenerative changes in his neck and is well versed in the progression from conservative to more aggressive treatment for spinal problems.  He is encouraged to come back to ER if he develops any red flag symptoms as discussed at length. I ordered medication including Toradol for pain Reevaluation of the patient after these medicines showed that the patient improved I have  reviewed the patients home medicines and have made adjustments as needed     Risk Prescription drug management.           Final Clinical Impression(s) / ED Diagnoses Final diagnoses:  Acute bilateral low back pain with right-sided sciatica    Rx / DC Orders ED Discharge Orders     None         Josem Kaufmann 05/29/23 2031    Melene Plan, DO 05/29/23 2215

## 2023-05-29 NOTE — ED Notes (Signed)
Discharge paperwork reviewed entirely with patient, including follow up care. Pain was under control. The patient received instruction and coaching on their prescriptions, and all follow-up questions were answered.  Pt verbalized understanding as well as all parties involved. No questions or concerns voiced at the time of discharge. No acute distress noted.   Pt ambulated out to PVA without incident or assistance.  Pt advised they will seek followup care with a specialist and notify their PCP.

## 2023-05-29 NOTE — ED Triage Notes (Signed)
Pt reports having back pain x 1 week. States that the pain shoots down the back of his leg.

## 2023-05-29 NOTE — Discharge Instructions (Signed)
It was a pleasure taking care of you today.  You evaluated for low back pain that sometimes shoots into your leg.  This is often associated with degenerative disc disease such as you experienced in your neck.  Will treat with anti-inflammatories we can try muscle relaxers as well.  Muscle relaxants may make you sleepy so do not drive when taking these.  Follow-up closely with your primary care doctor, come back to the ER if you have any new or worsening symptoms.

## 2023-08-19 ENCOUNTER — Encounter (HOSPITAL_COMMUNITY): Payer: Self-pay

## 2023-08-19 ENCOUNTER — Ambulatory Visit (HOSPITAL_COMMUNITY)
Admission: EM | Admit: 2023-08-19 | Discharge: 2023-08-19 | Disposition: A | Discharge status: Home / Self Care | Payer: BC Managed Care – PPO | Attending: Emergency Medicine | Admitting: Emergency Medicine

## 2023-08-19 ENCOUNTER — Emergency Department (HOSPITAL_COMMUNITY)
Admission: EM | Admit: 2023-08-19 | Discharge: 2023-08-19 | Discharge status: Against Medical Advice | Payer: BC Managed Care – PPO | Source: Home / Self Care

## 2023-08-19 DIAGNOSIS — R112 Nausea with vomiting, unspecified: Secondary | ICD-10-CM | POA: Diagnosis not present

## 2023-08-19 DIAGNOSIS — R197 Diarrhea, unspecified: Secondary | ICD-10-CM | POA: Diagnosis not present

## 2023-08-19 MED ORDER — ONDANSETRON 4 MG PO TBDP
ORAL_TABLET | ORAL | Status: AC
  Filled 2023-08-19: qty 1

## 2023-08-19 MED ORDER — ONDANSETRON 4 MG PO TBDP
4.0000 mg | ORAL_TABLET | Freq: Once | ORAL | Status: AC
  Administered 2023-08-19: 4 mg via ORAL

## 2023-08-19 MED ORDER — ONDANSETRON 4 MG PO TBDP: 4.0000 mg | ORAL_TABLET | Freq: Three times a day (TID) | ORAL | 0 refills | Status: DC | PRN

## 2023-08-19 NOTE — ED Provider Notes (Signed)
 [facility]    CSN: [id number] Arrival date & time: 08/19/23  1537      History   Chief Complaint Chief Complaint  Patient presents with   Abdominal Pain    HPI Roberto Goodman is a 43 y.o. male.   Patient presents to clinic for upper abdominal pain that started suddenly after eating biscuit fell around 1030 this morning.  He had a sausage egg and cheese biscuit and a hash brown.  Immediately afterwards he had abdominal pain, nausea, vomiting and diarrhea.  Thinks he has had about 5 episodes of vomiting.  It was watery and contained his food.  Diarrhea has also been watery.    The history is provided by the patient and medical records.  Abdominal Pain   History reviewed. No pertinent past medical history.  Patient Active Problem List   Diagnosis Date Noted   Hypokalemia 03/23/2016   Fever 03/21/2016   Generalized abdominal pain 03/21/2016   SIRS (systemic inflammatory response syndrome) (HCC) 03/21/2016   Lactic acidosis 03/21/2016   UTI (urinary tract infection) 12/28/2012    Past Surgical History:  Procedure Laterality Date   BACK SURGERY     CERVICAL FUSION         Home Medications    Prior to Admission medications   Medication Sig Start Date End Date Taking? Authorizing Provider  ondansetron  (ZOFRAN -ODT) 4 MG disintegrating tablet Take 1 tablet (4 mg total) by mouth every 8 (eight) hours as needed for nausea or vomiting. 08/19/23  Yes Dreama, Hebert Dooling  N, FNP    Family History History reviewed. No pertinent family history.  Social History Social History   Tobacco Use   Smoking status: Former    Current packs/day: 0.00    Types: Cigarettes    Quit date: 2013    Years since quitting: 12.0   Smokeless tobacco: Never  Vaping Use   Vaping status: Never Used  Substance Use Topics   Alcohol use: Yes   Drug use: Not Currently    Types: Marijuana    Comment: daily use     Allergies   Patient has no known allergies.   Review of  Systems Review of Systems  Per HPI   Physical Exam Triage Vital Signs ED Triage Vitals [08/19/23 1556]  Encounter Vitals Group     BP 117/70     Systolic BP Percentile      Diastolic BP Percentile      Pulse Rate (!) 51     Resp 16     Temp 97.6 F (36.4 C)     Temp Source Oral     SpO2 100 %     Weight 140 lb (63.5 kg)     Height 5' 7 (1.702 m)     Head Circumference      Peak Flow      Pain Score 10     Pain Loc      Pain Education      Exclude from Growth Chart    No data found.  Updated Vital Signs BP 117/70 (BP Location: Right Arm)   Pulse (!) 51   Temp 97.6 F (36.4 C) (Oral)   Resp 16   Ht 5' 7 (1.702 m)   Wt 140 lb (63.5 kg)   SpO2 100%   BMI 21.93 kg/m   Visual Acuity Right Eye Distance:   Left Eye Distance:   Bilateral Distance:    Right Eye Near:   Left Eye Near:  Bilateral Near:     Physical Exam Vitals and nursing note reviewed.  Constitutional:      Appearance: He is well-developed.  HENT:     Head: Normocephalic and atraumatic.     Mouth/Throat:     Mouth: Mucous membranes are moist.  Cardiovascular:     Rate and Rhythm: Normal rate.  Pulmonary:     Effort: Pulmonary effort is normal. No respiratory distress.  Abdominal:     General: Abdomen is flat. Bowel sounds are normal.     Palpations: Abdomen is soft.     Tenderness: There is abdominal tenderness in the epigastric area.  Skin:    General: Skin is warm and dry.  Neurological:     General: No focal deficit present.     Mental Status: He is alert and oriented to person, place, and time.  Psychiatric:        Mood and Affect: Mood normal.        Behavior: Behavior normal.      UC Treatments / Results  Labs (all labs ordered are listed, but only abnormal results are displayed) Labs Reviewed - No data to display  EKG   Radiology No results found.  Procedures Procedures (including critical care time)  Medications Ordered in UC Medications  ondansetron   (ZOFRAN -ODT) disintegrating tablet 4 mg (4 mg Oral Given 08/19/23 1643)    Initial Impression / Assessment and Plan / UC Course  I have reviewed the triage vital signs and the nursing notes.  Pertinent labs & imaging results that were available during my care of the patient were reviewed by me and considered in my medical decision making (see chart for details).  Vitals and triage reviewed, patient is hemodynamically stable.  Active bowel sounds, does have epigastric tenderness.  No rebound, guarding or concern for surgical abdomen at this time.  Of note, patient has not had any vomiting and his almost 2 hours in clinic.  Tolerating p.o. fluids after Zofran .  Suspect food poisoning versus viral gastroenteritis.  Symptom management reviewed and discussed.  Plan of care, follow-up care return precautions given, no questions at this time.     Final Clinical Impressions(s) / UC Diagnoses   Final diagnoses:  Nausea vomiting and diarrhea     Discharge Instructions      I suspect you have food poisoning from your [place] intake.  Follow a bland diet.  Gradually rehydrate with water, ginger ale if tolerated, then can progress to soup and broth and if this goes well you can do solids like Jell-O, rice, toast and applesauce.  Use the nausea medicine as needed.  Do not hesitate to seek immediate care at the nearest emergency department if you develop the worst abdominal pain of your life, uncontrollable vomiting, diarrhea, or any new concerning symptoms.      ED Prescriptions     Medication Sig Dispense Auth. Provider   ondansetron  (ZOFRAN -ODT) 4 MG disintegrating tablet Take 1 tablet (4 mg total) by mouth every 8 (eight) hours as needed for nausea or vomiting. 20 tablet Dreama, Gokul Waybright  N, FNP      PDMP not reviewed this encounter.   Dreama Garnet SAILOR, FNP 08/19/23 1731

## 2023-08-19 NOTE — ED Triage Notes (Signed)
 Patient here today with c/o mid abd pain that started this morning at 11 after eating [place].

## 2023-08-19 NOTE — Discharge Instructions (Signed)
 I suspect you have food poisoning from your bisc[place] intake.  Follow a bland diet.  Gradually rehydrate with water, ginger ale if tolerated, then can progress to soup and broth and if this goes well you can do solids like Jell-O, rice, toast and applesauce.  Use the nausea medicine as needed.  Do not hesitate to seek immediate care at the nearest emergency department if you develop the worst abdominal pain of your life, uncontrollable vomiting, diarrhea, or any new concerning symptoms.

## 2023-08-21 ENCOUNTER — Emergency Department (HOSPITAL_COMMUNITY)
Admission: EM | Admit: 2023-08-21 | Discharge: 2023-08-21 | Discharge status: Against Medical Advice | Payer: BC Managed Care – PPO | Attending: Emergency Medicine | Admitting: Emergency Medicine

## 2023-08-21 DIAGNOSIS — Z5321 Procedure and treatment not carried out due to patient leaving prior to being seen by health care provider: Secondary | ICD-10-CM | POA: Insufficient documentation

## 2023-08-21 DIAGNOSIS — R197 Diarrhea, unspecified: Secondary | ICD-10-CM | POA: Insufficient documentation

## 2023-08-21 DIAGNOSIS — Z20822 Contact with and (suspected) exposure to covid-19: Secondary | ICD-10-CM | POA: Diagnosis not present

## 2023-08-21 DIAGNOSIS — R109 Unspecified abdominal pain: Secondary | ICD-10-CM | POA: Insufficient documentation

## 2023-08-21 DIAGNOSIS — R531 Weakness: Secondary | ICD-10-CM | POA: Diagnosis not present

## 2023-08-21 DIAGNOSIS — R112 Nausea with vomiting, unspecified: Secondary | ICD-10-CM | POA: Diagnosis present

## 2023-08-21 LAB — RESP PANEL BY RT-PCR (RSV, FLU A&B, COVID)  RVPGX2
Influenza A by PCR: NEGATIVE
Influenza B by PCR: NEGATIVE
Resp Syncytial Virus by PCR: NEGATIVE
SARS Coronavirus 2 by RT PCR: NEGATIVE

## 2023-08-21 NOTE — ED Triage Notes (Signed)
 Pt seen at [facility] dx with food poisoning , given zofran and told to go to ER if doesn't improve. Pt here today with no improvement , reports unable to keep food down, c/o n/v/d, weakness and low energy and abd pain

## 2023-08-21 NOTE — ED Notes (Signed)
 Pt decided to leave due to crowded lobby. Writer informed Triage RN.

## 2023-08-22 ENCOUNTER — Encounter (HOSPITAL_BASED_OUTPATIENT_CLINIC_OR_DEPARTMENT_OTHER): Payer: Self-pay

## 2023-08-22 ENCOUNTER — Other Ambulatory Visit: Payer: Self-pay

## 2023-08-22 ENCOUNTER — Emergency Department (HOSPITAL_BASED_OUTPATIENT_CLINIC_OR_DEPARTMENT_OTHER): Payer: BC Managed Care – PPO

## 2023-08-22 ENCOUNTER — Emergency Department (HOSPITAL_BASED_OUTPATIENT_CLINIC_OR_DEPARTMENT_OTHER)
Admission: EM | Admit: 2023-08-22 | Discharge: 2023-08-22 | Disposition: A | Discharge status: Home / Self Care | Payer: BC Managed Care – PPO | Attending: Emergency Medicine | Admitting: Emergency Medicine

## 2023-08-22 ENCOUNTER — Ambulatory Visit
Admission: EM | Admit: 2023-08-22 | Discharge: 2023-08-22 | Disposition: A | Discharge status: Home / Self Care | Payer: BC Managed Care – PPO | Attending: Internal Medicine | Admitting: Internal Medicine

## 2023-08-22 DIAGNOSIS — R1010 Upper abdominal pain, unspecified: Secondary | ICD-10-CM

## 2023-08-22 DIAGNOSIS — K253 Acute gastric ulcer without hemorrhage or perforation: Secondary | ICD-10-CM | POA: Insufficient documentation

## 2023-08-22 DIAGNOSIS — R112 Nausea with vomiting, unspecified: Secondary | ICD-10-CM | POA: Diagnosis not present

## 2023-08-22 DIAGNOSIS — R109 Unspecified abdominal pain: Secondary | ICD-10-CM | POA: Diagnosis present

## 2023-08-22 LAB — COMPREHENSIVE METABOLIC PANEL
ALT: 23 U/L (ref 0–44)
AST: 29 U/L (ref 15–41)
Albumin: 4.7 g/dL (ref 3.5–5.0)
Alkaline Phosphatase: 41 U/L (ref 38–126)
Anion gap: 11 (ref 5–15)
BUN: 15 mg/dL (ref 6–20)
CO2: 25 mmol/L (ref 22–32)
Calcium: 9.8 mg/dL (ref 8.9–10.3)
Chloride: 99 mmol/L (ref 98–111)
Creatinine, Ser: 1.03 mg/dL (ref 0.61–1.24)
GFR, Estimated: >60 mL/min (ref >60)
Glucose, Bld: 117 mg/dL — ABNORMAL HIGH (ref 70–99)
Potassium: 3.4 mmol/L — ABNORMAL LOW (ref 3.5–5.1)
Sodium: 135 mmol/L (ref 135–145)
Total Bilirubin: 1.6 mg/dL — ABNORMAL HIGH (ref 0.0–1.2)
Total Protein: 7.7 g/dL (ref 6.5–8.1)

## 2023-08-22 LAB — CBC WITH DIFFERENTIAL/PLATELET
Abs Immature Granulocytes: 0.03 10*3/uL (ref 0.00–0.07)
Basophils Absolute: 0 10*3/uL (ref 0.0–0.1)
Basophils Relative: 0 %
Eosinophils Absolute: 0 10*3/uL (ref 0.0–0.5)
Eosinophils Relative: 0 %
HCT: 46.5 % (ref 39.0–52.0)
Hemoglobin: 16.2 g/dL (ref 13.0–17.0)
Immature Granulocytes: 0 %
Lymphocytes Relative: 15 %
Lymphs Abs: 1.8 10*3/uL (ref 0.7–4.0)
MCH: 28.2 pg (ref 26.0–34.0)
MCHC: 34.8 g/dL (ref 30.0–36.0)
MCV: 81 fL (ref 80.0–100.0)
Monocytes Absolute: 1.1 10*3/uL — ABNORMAL HIGH (ref 0.1–1.0)
Monocytes Relative: 9 %
Neutro Abs: 9.2 10*3/uL — ABNORMAL HIGH (ref 1.7–7.7)
Neutrophils Relative %: 76 %
Platelets: 258 10*3/uL (ref 150–400)
RBC: 5.74 MIL/uL (ref 4.22–5.81)
RDW: 14.4 % (ref 11.5–15.5)
WBC: 12.2 10*3/uL — ABNORMAL HIGH (ref 4.0–10.5)
nRBC: 0 % (ref 0.0–0.2)

## 2023-08-22 LAB — CK: Total CK: 474 U/L — ABNORMAL HIGH (ref 49–397)

## 2023-08-22 LAB — LIPASE, BLOOD: Lipase: 79 U/L — ABNORMAL HIGH (ref 11–51)

## 2023-08-22 LAB — MAGNESIUM: Magnesium: 1.9 mg/dL (ref 1.7–2.4)

## 2023-08-22 MED ORDER — SUCRALFATE 1 G PO TABS: 1.0000 g | ORAL_TABLET | Freq: Three times a day (TID) | ORAL | 0 refills | Status: DC

## 2023-08-22 MED ORDER — DROPERIDOL 2.5 MG/ML IJ SOLN
1.2500 mg | Freq: Once | INTRAMUSCULAR | Status: AC
  Administered 2023-08-22: 1.25 mg via INTRAVENOUS
  Filled 2023-08-22: qty 2

## 2023-08-22 MED ORDER — ALUM & MAG HYDROXIDE-SIMETH 200-200-20 MG/5ML PO SUSP: 30.0000 mL | Freq: Once | ORAL | Status: DC

## 2023-08-22 MED ORDER — IOHEXOL 300 MG/ML  SOLN
100.0000 mL | Freq: Once | INTRAMUSCULAR | Status: AC | PRN
Start: 2023-08-22 — End: 2023-08-22
  Administered 2023-08-22: 100 mL via INTRAVENOUS

## 2023-08-22 MED ORDER — LIDOCAINE VISCOUS HCL 2 % MT SOLN: 15.0000 mL | Freq: Once | OROMUCOSAL | Status: DC

## 2023-08-22 MED ORDER — PANTOPRAZOLE SODIUM 20 MG PO TBEC: 20.0000 mg | DELAYED_RELEASE_TABLET | Freq: Every day | ORAL | 2 refills | Status: DC

## 2023-08-22 MED ORDER — PROMETHAZINE HCL 25 MG RE SUPP: 25.0000 mg | Freq: Four times a day (QID) | RECTAL | 0 refills | Status: DC | PRN

## 2023-08-22 MED ORDER — ONDANSETRON HCL 4 MG/2ML IJ SOLN: 4.0000 mg | Freq: Once | INTRAMUSCULAR | Status: DC

## 2023-08-22 MED ORDER — LACTATED RINGERS IV BOLUS
1000.0000 mL | Freq: Once | INTRAVENOUS | Status: AC
  Administered 2023-08-22: 1000 mL via INTRAVENOUS

## 2023-08-22 MED ORDER — POTASSIUM CHLORIDE CRYS ER 20 MEQ PO TBCR: 20.0000 meq | EXTENDED_RELEASE_TABLET | Freq: Once | ORAL | 0 refills | Status: DC

## 2023-08-22 MED ORDER — PROMETHAZINE HCL 25 MG PO TABS: 25.0000 mg | ORAL_TABLET | Freq: Four times a day (QID) | ORAL | 0 refills | Status: DC | PRN

## 2023-08-22 NOTE — ED Provider Notes (Signed)
 [facility]    CSN: [id number] Arrival date & time: 08/22/23  0801      History   Chief Complaint Chief Complaint  Patient presents with   Abdominal Pain   Food Poisoning    HPI Roberto Goodman is a 43 y.o. male.   Patient presents to urgent care for evaluation of nausea, vomiting, diarrhea, and abdominal pain that started 3 days ago after eating [place]. He developed severe upper abdominal pain/epigastric pain last night that has persisted into this morning. Pain is worsened by movement, drinking water, and vomiting. Last episode of emesis was last night and he has not been able to keep down any food/fluids in the last 24 hours without vomiting. He had diarrhea at beginning of illness, this has since resolved. Denies recent alcohol use, urinary symptoms, frequent use of NSAIDs, hematemesis, bilious emesis, flank pain, lower abdominal pain, fever, chills, dizziness, viral URI symptoms, and syncope. Taking zofran  as prescribed at urgent care visit 3 days ago without relief.    Abdominal Pain   History reviewed. No pertinent past medical history.  Patient Active Problem List   Diagnosis Date Noted   Hypokalemia 03/23/2016   Fever 03/21/2016   Generalized abdominal pain 03/21/2016   SIRS (systemic inflammatory response syndrome) (HCC) 03/21/2016   Lactic acidosis 03/21/2016   UTI (urinary tract infection) 12/28/2012    Past Surgical History:  Procedure Laterality Date   BACK SURGERY     CERVICAL FUSION         Home Medications    Prior to Admission medications   Medication Sig Start Date End Date Taking? Authorizing Provider  ondansetron  (ZOFRAN -ODT) 4 MG disintegrating tablet Take 1 tablet (4 mg total) by mouth every 8 (eight) hours as needed for nausea or vomiting. 08/19/23   Dreama Crispin SAILOR, FNP    Family History History reviewed. No pertinent family history.  Social History Social History   Tobacco Use   Smoking status: Former     Current packs/day: 0.00    Types: Cigarettes    Quit date: 2013    Years since quitting: 12.0   Smokeless tobacco: Never  Vaping Use   Vaping status: Never Used  Substance Use Topics   Alcohol use: Yes   Drug use: Not Currently    Types: Marijuana    Comment: daily use     Allergies   Patient has no known allergies.   Review of Systems Review of Systems  Gastrointestinal:  Positive for abdominal pain.  Per HPI   Physical Exam Triage Vital Signs ED Triage Vitals  Encounter Vitals Group     BP 08/22/23 0813 133/85     Systolic BP Percentile --      Diastolic BP Percentile --      Pulse Rate 08/22/23 0813 66     Resp 08/22/23 0813 18     Temp 08/22/23 0813 98 F (36.7 C)     Temp Source 08/22/23 0813 Oral     SpO2 08/22/23 0813 99 %     Weight --      Height --      Head Circumference --      Peak Flow --      Pain Score 08/22/23 0820 8     Pain Loc --      Pain Education --      Exclude from Growth Chart --    No data found.  Updated Vital Signs BP 133/85 (BP Location: Right Arm)  Pulse 66   Temp 98 F (36.7 C) (Oral)   Resp 18   SpO2 99%   Visual Acuity Right Eye Distance:   Left Eye Distance:   Bilateral Distance:    Right Eye Near:   Left Eye Near:    Bilateral Near:     Physical Exam Vitals and nursing note reviewed.  Constitutional:      Appearance: He is not ill-appearing or toxic-appearing.  HENT:     Head: Normocephalic and atraumatic.     Right Ear: Hearing and external ear normal.     Left Ear: Hearing and external ear normal.     Nose: Nose normal.     Mouth/Throat:     Lips: Pink.  Eyes:     General: Lids are normal. Vision grossly intact. Gaze aligned appropriately.     Extraocular Movements: Extraocular movements intact.     Conjunctiva/sclera: Conjunctivae normal.  Cardiovascular:     Rate and Rhythm: Normal rate and regular rhythm.     Heart sounds: Normal heart sounds, S1 normal and S2 normal.  Pulmonary:     Effort:  Pulmonary effort is normal. No respiratory distress.     Breath sounds: Normal breath sounds and air entry.  Abdominal:     General: Bowel sounds are normal.     Palpations: Abdomen is soft.     Tenderness: There is abdominal tenderness (tearful after palpation to the abdomen secondary to pain) in the right upper quadrant, epigastric area and left upper quadrant. There is no right CVA tenderness, left CVA tenderness, guarding or rebound. Negative signs include Murphy's sign and McBurney's sign.  Musculoskeletal:     Cervical back: Neck supple.  Skin:    General: Skin is warm and dry.     Capillary Refill: Capillary refill takes less than 2 seconds.     Findings: No rash.  Neurological:     General: No focal deficit present.     Mental Status: He is alert and oriented to person, place, and time. Mental status is at baseline.     Cranial Nerves: No dysarthria or facial asymmetry.  Psychiatric:        Mood and Affect: Mood normal.        Speech: Speech normal.        Behavior: Behavior normal.        Thought Content: Thought content normal.        Judgment: Judgment normal.      UC Treatments / Results  Labs (all labs ordered are listed, but only abnormal results are displayed) Labs Reviewed - No data to display  EKG   Radiology No results found.  Procedures Procedures (including critical care time)  Medications Ordered in UC Medications  ondansetron  (ZOFRAN ) injection 4 mg (4 mg Intramuscular Not Given 08/22/23 0831)  alum & mag hydroxide-simeth (MAALOX/MYLANTA) 200-200-20 MG/5ML suspension 30 mL (30 mLs Oral Not Given 08/22/23 0831)  lidocaine  (XYLOCAINE ) 2 % viscous mouth solution 15 mL (15 mLs Mouth/Throat Not Given 08/22/23 0831)    Initial Impression / Assessment and Plan / UC Course  I have reviewed the triage vital signs and the nursing notes.  Pertinent labs & imaging results that were available during my care of the patient were reviewed by me and considered in my  medical decision making (see chart for details).   1. Upper abdominal pain, nausea and vomiting Decreased urinary output, severe upper abdominal pain, and persistent nausea and vomiting concerning for possible emergent intraabdominal pathology.  Recommend transfer to ED for further workup and evaluation to rule out abdominal emergency.   Discussed clinical concerns/exam findings leading to recommendation for further workup in the ER setting and risks of deferring ER visit with patient/family. Patient/family express understanding and agreement with plan, discharged to ER via personal vehicle with family member.  Final Clinical Impressions(s) / UC Diagnoses   Final diagnoses:  Upper abdominal pain  Nausea and vomiting, unspecified vomiting type     Discharge Instructions      Please go to the nearest ER for further workup and evaluation to rule out abdominal emergency.     ED Prescriptions   None    PDMP not reviewed this encounter.   Enedelia Going [place], [place] 08/22/23 [phone]

## 2023-08-22 NOTE — ED Notes (Signed)
 Patient is being discharged from the Urgent Care and sent to the Emergency Department via private vehicle . Per Enedelia NP, patient is in need of higher level of care due to abdominal pain. Patient is aware and verbalizes understanding of plan of care.  Vitals:   08/22/23 0813  BP: 133/85  Pulse: 66  Resp: 18  Temp: 98 F (36.7 C)  SpO2: 99%

## 2023-08-22 NOTE — ED Triage Notes (Addendum)
 Pt presents with complaints of 8/10 upper abdominal pain since Saturday 1/4 after eating meal at restaurant. Pt currently denies diarrhea but does endorse nausea and vomiting. Pt states if I eat or drink anything, it comes right back up. Pt's support person voices concerns as he is not making much urine. Only medications taken are Pepto-Bismol and Zofran  with no improvement. Pt did go to urgent care and emergency room. ED wait was eight hours, pt left.

## 2023-08-22 NOTE — ED Notes (Signed)
 Pt given water for PO challenge, will attempt ginger ale and saltines once tolerating water

## 2023-08-22 NOTE — Discharge Instructions (Signed)
 As discussed, workup today overall reassuring.  You did have slightly low potassium today which showed a few days worth of potassium at home for replacement.  CT scan did show evidence of gastritis versus gastric ulcer or stomach ulcer.  See information attached to your discharge papers regarding dietary changes to make to help ulcer heal.  Will also put you on Protonix  to take daily; this is a reflux medicine which should help with the healing of the ulcer.  Will also give you Carafate  to use as needed to help with symptoms.  Already nausea and vomiting, will send you home with a different medicine called Phenergan  to take as needed.  There is both oral and rectal forms.  Regarding ulcer, recommend follow-up with GI specialist in the outpatient setting.  Will attach number to call to schedule an appointment.  Please do not hesitate to return if the worrisome signs and symptoms we discussed become apparent.

## 2023-08-22 NOTE — ED Notes (Signed)
 Pt tolerating PO intake, ready for d/c.   D/c paperwork reviewed with pt, including prescriptions and follow up care.  All questions and/or concerns addressed at time of d/c.  No further needs expressed. . Pt verbalized understanding, Ambulatory with family to ED exit, NAD.

## 2023-08-22 NOTE — ED Notes (Signed)
 Pt asleep at this time

## 2023-08-22 NOTE — ED Triage Notes (Signed)
 C/o abdominal pain, vomiting since Saturday. Unable to tolerate PO, vomits fluids. Decreased urine output, states very dark urine yesterday. Abdominal tenderness, in tears in triage.

## 2023-08-22 NOTE — ED Provider Notes (Signed)
 [facility] Provider Note   CSN: [id number] Arrival date & time: 08/22/23  9096     History  Chief Complaint  Patient presents with   Abdominal Pain    Roberto Goodman is a 43 y.o. male.   Abdominal Pain   43 year old male presents emergency department with complaints of abdominal pain, nausea, vomiting, diarrhea.  Patient has been ill with symptoms since Saturday.  States that he ate [place] and began to have pain in his upper middle abdomen.  States that he has had 5-6 episodes of emesis daily some symptom onset.  Has had difficulty tolerating p.o. since symptom onset.  States that diarrhea resolved 2 days ago but is still been with abdominal pain as well as nausea/vomiting.  Was seen in urgent care today the symptoms began was sent home Zofran  but states his symptoms have been refractory to at home Zofran .  Was seen at the urgent care again today and sent to emergency department for further assessment.  States that he has noticed dysuria and also being very dark in appearance compared to what it is at baseline.  Denies any radiation of pain.  Denies any chest pain, shortness of breath, fever, urinary symptoms, change in bowel habits, hematochezia, melena, hematemesis.  Denies any alcohol, marijuana, illicit substance use, NSAID use.  No significant pertinent past medical history.  Home Medications Prior to Admission medications   Medication Sig Start Date End Date Taking? Authorizing Provider  pantoprazole  (PROTONIX ) 20 MG tablet Take 1 tablet (20 mg total) by mouth daily. 08/22/23  Yes Silver Fell A, PA  potassium chloride  SA (KLOR-CON  M) 20 MEQ tablet Take 1 tablet (20 mEq total) by mouth once for 1 dose. 08/22/23 08/22/23 Yes Silver Fell A, PA  promethazine  (PHENERGAN ) 25 MG suppository Place 1 suppository (25 mg total) rectally every 6 (six) hours as needed for nausea or vomiting. 08/22/23  Yes Silver Fell A, PA   promethazine  (PHENERGAN ) 25 MG tablet Take 1 tablet (25 mg total) by mouth every 6 (six) hours as needed for nausea or vomiting. 08/22/23  Yes Silver Fell A, PA  sucralfate  (CARAFATE ) 1 g tablet Take 1 tablet (1 g total) by mouth 4 (four) times daily -  with meals and at bedtime. Use as needed up to 4 times daily 08/22/23  Yes Silver Fell A, PA  ondansetron  (ZOFRAN -ODT) 4 MG disintegrating tablet Take 1 tablet (4 mg total) by mouth every 8 (eight) hours as needed for nausea or vomiting. 08/19/23   Dreama, [place]  N, FNP      Allergies    Patient has no known allergies.    Review of Systems   Review of Systems  Gastrointestinal:  Positive for abdominal pain.  All other systems reviewed and are negative.   Physical Exam Updated Vital Signs BP (!) 160/99   Pulse (!) 52   Temp 97.9 F (36.6 C)   Resp 18   Ht 5' 7 (1.702 m)   Wt 63.5 kg   SpO2 100%   BMI 21.93 kg/m  Physical Exam Vitals and nursing note reviewed.  Constitutional:      General: He is not in acute distress.    Appearance: He is well-developed.  HENT:     Head: Normocephalic and atraumatic.  Eyes:     Conjunctiva/sclera: Conjunctivae normal.  Cardiovascular:     Rate and Rhythm: Normal rate and regular rhythm.     Heart sounds: No murmur heard. Pulmonary:  Effort: Pulmonary effort is normal. No respiratory distress.     Breath sounds: Normal breath sounds.  Abdominal:     Palpations: Abdomen is soft.     Tenderness: There is abdominal tenderness. There is no right CVA tenderness, left CVA tenderness or guarding. Negative signs include Murphy's sign.     Comments: Generalized upper abdominal tenderness with most in epigastric region.  Musculoskeletal:        General: No swelling.     Cervical back: Neck supple.  Skin:    General: Skin is warm and dry.     Capillary Refill: Capillary refill takes less than 2 seconds.  Neurological:     Mental Status: He is alert.  Psychiatric:        Mood and  Affect: Mood normal.     ED Results / Procedures / Treatments   Labs (all labs ordered are listed, but only abnormal results are displayed) Labs Reviewed  COMPREHENSIVE METABOLIC PANEL - Abnormal; Notable for the following components:      Result Value   Potassium 3.4 (*)    Glucose, Bld 117 (*)    Total Bilirubin 1.6 (*)    All other components within normal limits  CBC WITH DIFFERENTIAL/PLATELET - Abnormal; Notable for the following components:   WBC 12.2 (*)    Neutro Abs 9.2 (*)    Monocytes Absolute 1.1 (*)    All other components within normal limits  LIPASE, BLOOD - Abnormal; Notable for the following components:   Lipase 79 (*)    All other components within normal limits  CK - Abnormal; Notable for the following components:   Total CK 474 (*)    All other components within normal limits  MAGNESIUM    EKG None  Radiology CT ABDOMEN PELVIS W CONTRAST Result Date: 08/22/2023 CLINICAL DATA:  Acute abdominal pain. EXAM: CT ABDOMEN AND PELVIS WITH CONTRAST TECHNIQUE: Multidetector CT imaging of the abdomen and pelvis was performed using the standard protocol following bolus administration of intravenous contrast. RADIATION DOSE REDUCTION: This exam was performed according to the departmental dose-optimization program which includes automated exposure control, adjustment of the mA and/or kV according to patient size and/or use of iterative reconstruction technique. CONTRAST:  OMNIPAQUE  IOHEXOL  300 MG/ML  SOLN COMPARISON:  Pelvis dated 05/29/2017. FINDINGS: Evaluation is limited due to streak artifact caused by patient's arms as well as due to paucity of intra-abdominal fat. Lower chest: The visualized lung bases are clear. No intra-abdominal free air or free fluid. Hepatobiliary: Multiple subcentimeter hepatic hypodense lesions are too small to characterize. No biliary dilatation. The gallbladder is unremarkable. Pancreas: Unremarkable. No pancreatic ductal dilatation or  surrounding inflammatory changes. Spleen: Normal in size without focal abnormality. Adrenals/Urinary Tract: The adrenal glands are unremarkable. The kidneys, and urinary bladder appear unremarkable. Stomach/Bowel: Evaluation of the bowel is limited in the absence of oral contrast as well as due to paucity of intra-abdominal fat. There is thickened appearance of the wall of the distal body of the stomach involving the gastric antrum. Although this may be related to underdistention, findings concerning for gastritis or a gastric ulcer. There is no bowel obstruction. The appendix is normal. Vascular/Lymphatic: The abdominal aorta and IVC are unremarkable. No portal venous gas. There is no adenopathy. Reproductive: The prostate and seminal vesicles are grossly unremarkable. Other: Paucity of subcutaneous fat. Musculoskeletal: Multilevel degenerative changes with disc desiccation and vacuum phenomena. No acute osseous pathology. IMPRESSION: 1. Findings concerning for gastritis or a gastric ulcer. Endoscopy may  provide better evaluation if clinically indicated. 2. No bowel obstruction. Normal appendix. Electronically Signed   By: Vanetta Chou M.D.   On: 08/22/2023 10:17    Procedures Procedures    Medications Ordered in ED Medications  droperidol  (INAPSINE ) 2.5 MG/ML injection 1.25 mg (1.25 mg Intravenous Given 08/22/23 0942)  lactated ringers  bolus 1,000 mL (0 mLs Intravenous Stopped 08/22/23 1141)  lactated ringers  bolus 1,000 mL (0 mLs Intravenous Stopped 08/22/23 1141)  iohexol  (OMNIPAQUE ) 300 MG/ML solution 100 mL (100 mLs Intravenous Contrast Given 08/22/23 1001)    ED Course/ Medical Decision Making/ A&P                                 Medical Decision Making Amount and/or Complexity of Data Reviewed Labs: ordered. Radiology: ordered.  Risk Prescription drug management.   This patient presents to the ED for concern of abdominal pain, nausea, vomiting, this involves an extensive number of  treatment options, and is a complaint that carries with it a high risk of complications and morbidity.  The differential diagnosis includes gastritis, PUD, cholecystitis, CBD pathology, SBO/LBO, volvulus, diverticulitis, appendicitis, pyelonephritis, nephrolithiasis, viral gastroenteritis, foodborne illness, other   Co morbidities that complicate the patient evaluation  See HPI   Additional history obtained:  Additional history obtained from EMR External records from outside source obtained and reviewed including hospital records   Lab Tests:  I Ordered, and personally interpreted labs.  The pertinent results include: Leukocytosis of 12.2.  No evidence of anemia.  Platelets within range.  Hypokalemia 3.4 otherwise, electrolytes abnormalities.  No transaminitis.  Isolated elevation of total bilirubin of 1.6.  No renal dysfunction.  Lipase elevated 79 without diagnostic criteria for pancreatitis.  CK mildly elevated at 474.  UA patient declined   Imaging Studies ordered:  I ordered imaging studies including CT abdomen pelvis I independently visualized and interpreted imaging which showed gastritis or gastric ulcer.  No bowel obstruction.  Normal appendix. I agree with the radiologist interpretation  Cardiac Monitoring: / EKG:  The patient was maintained on a cardiac monitor.  I personally viewed and interpreted the cardiac monitored which showed an underlying rhythm of: Sinus rhythm   Consultations Obtained:  N/a   Problem List / ED Course / Critical interventions / Medication management  Upper abdominal pain, nausea, vomiting I ordered medication including lactated Ringer 's, droperidol    Reevaluation of the patient after these medicines showed that the patient improved I have reviewed the patients home medicines and have made adjustments as needed   Social Determinants of Health:  Former cigarette use.  Denies illicit drug use.   Test / Admission - Considered:  Upper  abdominal pain, nausea, vomiting Vitals signs significant for hypertension blood pressure 149/98. Otherwise within normal range and stable throughout visit. Laboratory/imaging studies significant for: See above 43 year old male presents emergency department with complaints of upper abdominal pain, nausea, vomiting, diarrhea.  Symptoms present for the past 3 days.  Diarrhea seems to resolve but still with upper abdominal pain, nausea and emesis.  On exam, diffuse upper abdominal tenderness left most so in epigastric region.  Labs concerning for leukocytosis of 12 but otherwise reassuring for emergent acute abnormality.  CT imaging concerning for gastritis versus PUD.  Treated with medicines and noted significant improvement of symptoms and subsequent tolerance of p.o.  Suspect patient could have had viral gastroenteritis versus foodborne illness as not having pain mainly from gastric ulcer versus gastritis.  Will place patient on PPI and have follow-up with PCP/GI in the outpatient setting for reassessment.  Will recommend appropriate lifestyle modifications as well regarding condition.  Treatment plan discussed at length with patient and he acknowledged understanding was agreeable to said plan.  Patient overall well-appearing, afebrile in no acute distress, tolerating p.o. without difficulty. Worrisome signs and symptoms were discussed with the patient, and the patient acknowledged understanding to return to the ED if noticed. Patient was stable upon discharge.          Final Clinical Impression(s) / ED Diagnoses Final diagnoses:  Upper abdominal pain  Acute gastric ulcer, unspecified whether gastric ulcer hemorrhage or perforation present  Nausea and vomiting, unspecified vomiting type    Rx / DC Orders ED Discharge Orders          Ordered    pantoprazole  (PROTONIX ) 20 MG tablet  Daily        08/22/23 1037    sucralfate  (CARAFATE ) 1 g tablet  3 times daily with meals & bedtime         08/22/23 1037    potassium chloride  SA (KLOR-CON  M) 20 MEQ tablet   Once        08/22/23 1037    promethazine  (PHENERGAN ) 25 MG tablet  Every 6 hours PRN        08/22/23 1037    promethazine  (PHENERGAN ) 25 MG suppository  Every 6 hours PRN        08/22/23 1037              Silver Wonda LABOR, [place] 08/22/23 1854    Elnor Bernarda SQUIBB, DO 08/24/23 [phone]

## 2023-08-22 NOTE — Discharge Instructions (Signed)
 Please go to the nearest ER for further workup and evaluation to rule out abdominal emergency.

## 2023-10-18 ENCOUNTER — Ambulatory Visit: Payer: Self-pay | Admitting: General Practice

## 2023-10-18 ENCOUNTER — Ambulatory Visit (HOSPITAL_COMMUNITY)
Admission: EM | Admit: 2023-10-18 | Discharge: 2023-10-18 | Disposition: A | Discharge status: Home / Self Care | Attending: Emergency Medicine | Admitting: Emergency Medicine

## 2023-10-18 ENCOUNTER — Encounter (HOSPITAL_COMMUNITY): Payer: Self-pay | Admitting: *Deleted

## 2023-10-18 DIAGNOSIS — R200 Anesthesia of skin: Secondary | ICD-10-CM | POA: Diagnosis not present

## 2023-10-18 DIAGNOSIS — G8929 Other chronic pain: Secondary | ICD-10-CM | POA: Diagnosis not present

## 2023-10-18 DIAGNOSIS — M542 Cervicalgia: Secondary | ICD-10-CM

## 2023-10-18 DIAGNOSIS — M545 Low back pain, unspecified: Secondary | ICD-10-CM

## 2023-10-18 MED ORDER — DEXAMETHASONE SODIUM PHOSPHATE 10 MG/ML IJ SOLN
10.0000 mg | Freq: Once | INTRAMUSCULAR | Status: AC
  Administered 2023-10-18: 10 mg via INTRAMUSCULAR

## 2023-10-18 MED ORDER — DEXAMETHASONE SODIUM PHOSPHATE 10 MG/ML IJ SOLN
INTRAMUSCULAR | Status: AC
  Filled 2023-10-18: qty 1

## 2023-10-18 MED ORDER — METHOCARBAMOL 500 MG PO TABS: 500.0000 mg | ORAL_TABLET | Freq: Two times a day (BID) | ORAL | 0 refills | Status: DC

## 2023-10-18 NOTE — ED Provider Notes (Signed)
 [facility]    CSN: [id number] Arrival date & time: 10/18/23  7829      History   Chief Complaint Chief Complaint  Patient presents with   Back Pain   Numbness    HPI Roberto Goodman is a 43 y.o. male.   Patient presents with chronic low back pain and neck pain that is progressively worsened over the last 2 to 3 days.  Patient reports waking up this morning with radiating pain and numbness to his right hand.  Patient reports that he does heavy lifting at work.  Denies any known recent injury.  History of cervical fusion.  Reports he took Aleve and Tylenol on Monday with minimal relief.  Patient states that he called sports medicine at [facility] and they advised that he be seen at urgent care before following up with them.   Back Pain   History reviewed. No pertinent past medical history.  Patient Active Problem List   Diagnosis Date Noted   Hypokalemia 03/23/2016   Fever 03/21/2016   Generalized abdominal pain 03/21/2016   SIRS (systemic inflammatory response syndrome) (HCC) 03/21/2016   Lactic acidosis 03/21/2016   UTI (urinary tract infection) 12/28/2012    Past Surgical History:  Procedure Laterality Date   BACK SURGERY     CERVICAL FUSION         Home Medications    Prior to Admission medications   Medication Sig Start Date End Date Taking? Authorizing Provider  methocarbamol (ROBAXIN) 500 MG tablet Take 1 tablet (500 mg total) by mouth 2 (two) times daily. 10/18/23  Yes Letta Kocher, NP    Family History History reviewed. No pertinent family history.  Social History Social History   Tobacco Use   Smoking status: Former    Current packs/day: 0.00    Types: Cigarettes    Quit date: 2013    Years since quitting: 12.1   Smokeless tobacco: Never  Vaping Use   Vaping status: Never Used  Substance Use Topics   Alcohol use: Yes   Drug use: Not Currently    Types: Marijuana    Comment: daily use     Allergies   Patient  has no known allergies.   Review of Systems Review of Systems  Musculoskeletal:  Positive for back pain.   Per HPI  Physical Exam Triage Vital Signs ED Triage Vitals  Encounter Vitals Group     BP 10/18/23 1058 109/78     Systolic BP Percentile --      Diastolic BP Percentile --      Pulse Rate 10/18/23 1058 67     Resp 10/18/23 1058 18     Temp 10/18/23 1058 97.9 F (36.6 C)     Temp Source 10/18/23 1058 Oral     SpO2 10/18/23 1058 99 %     Weight --      Height --      Head Circumference --      Peak Flow --      Pain Score 10/18/23 1057 7     Pain Loc --      Pain Education --      Exclude from Growth Chart --    No data found.  Updated Vital Signs BP 109/78 (BP Location: Right Arm)   Pulse 67   Temp 97.9 F (36.6 C) (Oral)   Resp 18   SpO2 99%   Visual Acuity Right Eye Distance:   Left Eye Distance:   Bilateral Distance:  Right Eye Near:   Left Eye Near:    Bilateral Near:     Physical Exam Vitals and nursing note reviewed.  Constitutional:      General: He is awake. He is not in acute distress.    Appearance: Normal appearance. He is well-developed and well-groomed. He is not ill-appearing.  Neck:     Comments: Endorses radiating pain to right arm and right low back with movement of neck. Musculoskeletal:     Right shoulder: Normal.     Right upper arm: Normal.     Right elbow: Normal.     Right forearm: Normal.     Right wrist: Normal.     Right hand: Normal.     Cervical back: Normal range of motion and neck supple. Pain with movement present. No muscular tenderness. Normal range of motion.     Thoracic back: Normal.     Lumbar back: Tenderness present. No swelling, edema, deformity or bony tenderness. Normal range of motion. Negative right straight leg raise test and negative left straight leg raise test.     Comments: Diffuse tenderness noted to right low back without swelling and decreased range of motion.  Neurological:     Mental  Status: He is alert.  Psychiatric:        Behavior: Behavior is cooperative.      UC Treatments / Results  Labs (all labs ordered are listed, but only abnormal results are displayed) Labs Reviewed - No data to display  EKG   Radiology No results found.  Procedures Procedures (including critical care time)  Medications Ordered in UC Medications  dexamethasone (DECADRON) injection 10 mg (10 mg Intramuscular Given 10/18/23 1150)    Initial Impression / Assessment and Plan / UC Course  I have reviewed the triage vital signs and the nursing notes.  Pertinent labs & imaging results that were available during my care of the patient were reviewed by me and considered in my medical decision making (see chart for details).     Upon assessment patient endorses radiating pain to right arm and right low back with movement of neck side-to-side.  Diffuse tenderness also noted to right low back without swelling and decreased range of motion.  Given Decadron injection to help decrease inflammation causing pain and radiating numbness.  Prescribed Robaxin as needed for muscle pain and spasms.  Discussed follow-up and return precautions Final Clinical Impressions(s) / UC Diagnoses   Final diagnoses:  Chronic right-sided low back pain without sciatica  Numbness  Neck pain     Discharge Instructions      You were given a steroid injection today to help with inflammation causing your pain. You can take Robaxin which is a muscle relaxer for muscle pain and spasms.  This can make you drowsy so do not drive, work, or drink while taking.  Otherwise you can alternate between Tylenol and ibuprofen as needed for pain.  You can also apply heat and perform gentle stretching to help with pain.  Follow-up with sports medicine or return here as needed.    ED Prescriptions     Medication Sig Dispense Auth. Provider   methocarbamol (ROBAXIN) 500 MG tablet Take 1 tablet (500 mg total) by mouth 2  (two) times daily. 20 tablet Wynonia Lawman A, NP      PDMP not reviewed this encounter.   Wynonia Lawman A, NP 10/18/23 1150

## 2023-10-18 NOTE — Discharge Instructions (Signed)
 You were given a steroid injection today to help with inflammation causing your pain. You can take Robaxin which is a muscle relaxer for muscle pain and spasms.  This can make you drowsy so do not drive, work, or drink while taking.  Otherwise you can alternate between Tylenol and ibuprofen as needed for pain.  You can also apply heat and perform gentle stretching to help with pain.  Follow-up with sports medicine or return here as needed.

## 2023-10-18 NOTE — ED Triage Notes (Signed)
 Pt states he had neck surgery in 2016 and 2017. He woke up this morning with back and neck pain that has turned into numbness in his right hand. He took some aleve and tylenol on Monday. He called sports med at [facility] they advised him to see urgent care first and if he needs to see them he can after. He does lift heavy things all day at work.

## 2023-10-18 NOTE — Telephone Encounter (Signed)
 Copied from CRM [id number]. Topic: Clinical - Red Word Triage >> Oct 18, 2023  8:11 AM Antwanette L wrote: Red Word that prompted transfer to Nurse Triage: severe pain in the middle/lower back.    Chief Complaint: Back Pain Symptoms: Pain Frequency: two weeks but worse in the past two days Pertinent Negatives: Patient denies weakness, numbness, or problems with bowel/bladder control, fever, abdomen pain, burning with urination, blood in urine. Disposition: [] ED /[x] Urgent Care (no appt availability in office) / [] Appointment(In office/virtual)/ []  [facility]/ [] Home Care/ [] Refused Recommended Disposition /[]  Mobile Bus/ []  Follow-up with PCP Additional Notes: Patient called and advised that he has been having lower back pain mostly on the right side.  He denies the pain radiating down either leg. Patient denies weakness, numbness, or problems with bowel/bladder control, fever, abdomen pain, burning with urination, blood in urine. Patient is advised that in 2016 he had neck surgery and they had to go back in 2017. Patient's issues today are with his lower back and he states that he does lift a lot of heavy things at his work and does a lot of twisting.  He is advised that he should see a provider in the next 3 days for evaluation.  He states that he will leave work, go to an urgent care nearby and then get set up with [place] where he would like his PCP to be established for further care. Care Advice given as per protocol. Patient is also advised that if anything worsens to go immediately to the emergency room.   Patient verbalized understanding.      Reason for Disposition  [1] MODERATE back pain (e.g., interferes with normal activities) AND [2] present > 3 days  Answer Assessment - Initial Assessment Questions 1. ONSET: "When did the pain begin?"      For about two weeks---but got worse 2 days  2. LOCATION: "Where does it hurt?" (upper, mid or lower back)     Lower back  on the right  3. SEVERITY: "How bad is the pain?"  (e.g., Scale 1-10; mild, moderate, or severe)   - MILD (1-3): Doesn't interfere with normal activities.    - MODERATE (4-7): Interferes with normal activities or awakens from sleep.    - SEVERE (8-10): Excruciating pain, unable to do any normal activities.      7 4. PATTERN: "Is the pain constant?" (e.g., yes, no; constant, intermittent)      intermittent 5. RADIATION: "Does the pain shoot into your legs or somewhere else?"     Upward on right side 6. CAUSE:  "What do you think is causing the back pain?"      unsure 7. BACK OVERUSE:  "Any recent lifting of heavy objects, strenuous work or exercise?"     Pt uses a lot of heavy objects and twisting motions 8. MEDICINES: "What have you taken so far for the pain?" (e.g., nothing, acetaminophen, NSAIDS)     Aleve and Tylenol 9. NEUROLOGIC SYMPTOMS: "Do you have any weakness, numbness, or problems with bowel/bladder control?"     No 10. OTHER SYMPTOMS: "Do you have any other symptoms?" (e.g., fever, abdomen pain, burning with urination, blood in urine)       No  Protocols used: Back Pain-A-AH

## 2023-10-23 ENCOUNTER — Ambulatory Visit (HOSPITAL_COMMUNITY)
Admission: EM | Admit: 2023-10-23 | Discharge: 2023-10-23 | Disposition: A | Discharge status: Home / Self Care | Attending: Family Medicine | Admitting: Family Medicine

## 2023-10-23 ENCOUNTER — Ambulatory Visit (INDEPENDENT_AMBULATORY_CARE_PROVIDER_SITE_OTHER)

## 2023-10-23 ENCOUNTER — Encounter (HOSPITAL_COMMUNITY): Payer: Self-pay

## 2023-10-23 DIAGNOSIS — M545 Low back pain, unspecified: Secondary | ICD-10-CM | POA: Diagnosis not present

## 2023-10-23 DIAGNOSIS — M62830 Muscle spasm of back: Secondary | ICD-10-CM

## 2023-10-23 MED ORDER — KETOROLAC TROMETHAMINE 30 MG/ML IJ SOLN
30.0000 mg | Freq: Once | INTRAMUSCULAR | Status: AC
  Administered 2023-10-23: 30 mg via INTRAMUSCULAR

## 2023-10-23 MED ORDER — MELOXICAM 15 MG PO TABS: 15.0000 mg | ORAL_TABLET | Freq: Every day | ORAL | 0 refills | Status: DC

## 2023-10-23 MED ORDER — KETOROLAC TROMETHAMINE 30 MG/ML IJ SOLN
INTRAMUSCULAR | Status: AC
  Filled 2023-10-23: qty 1

## 2023-10-23 MED ORDER — CYCLOBENZAPRINE HCL 10 MG PO TABS: 10.0000 mg | ORAL_TABLET | Freq: Two times a day (BID) | ORAL | 0 refills | Status: DC | PRN

## 2023-10-23 NOTE — Discharge Instructions (Addendum)
 Please continue doing meloxicam daily for the next 10 days, please take it with food.  You may start taking your meloxicam tomorrow.  Please apply heat over the area that bothers you.

## 2023-10-23 NOTE — ED Provider Notes (Addendum)
 [facility]    CSN: [id number] Arrival date & time: 10/23/23  1324      History   Chief Complaint Chief Complaint  Patient presents with   Back Pain    HPI Roberto Goodman is a 43 y.o. male.   Patient presenting with right-sided lower paraspinal lumbar back pain.  Patient states that he has been dealing this for 2 weeks but feels like it is getting worse.  Patient notes that yesterday he was getting out of his car and had sharp shooting pain on the right side of his back but folic it radiated to his legs.  Patient denies any radiation into his legs at this time.  Patient does have a significant history of cervical disc disease requiring fusion.  Patient has never had any lumbar pain prior to this.  Patient was seen in the urgent care approximately 1 week ago for something similar and at that time was given Robaxin and a steroid shot.  Patient notes minimal improvement after that.  No other concerns at this time.   Back Pain   History reviewed. No pertinent past medical history.  Patient Active Problem List   Diagnosis Date Noted   Hypokalemia 03/23/2016   Fever 03/21/2016   Generalized abdominal pain 03/21/2016   SIRS (systemic inflammatory response syndrome) (HCC) 03/21/2016   Lactic acidosis 03/21/2016   UTI (urinary tract infection) 12/28/2012    Past Surgical History:  Procedure Laterality Date   BACK SURGERY     CERVICAL FUSION         Home Medications    Prior to Admission medications   Medication Sig Start Date End Date Taking? Authorizing Provider  cyclobenzaprine (FLEXERIL) 10 MG tablet Take 1 tablet (10 mg total) by mouth 2 (two) times daily as needed for muscle spasms. 10/23/23  Yes Brenton Grills, MD  meloxicam (MOBIC) 15 MG tablet Take 1 tablet (15 mg total) by mouth daily. 10/23/23  Yes Brenton Grills, MD    Family History History reviewed. No pertinent family history.  Social History Social History   Tobacco Use   Smoking status: Former     Current packs/day: 0.00    Types: Cigarettes    Quit date: 2013    Years since quitting: 12.1   Smokeless tobacco: Never  Vaping Use   Vaping status: Never Used  Substance Use Topics   Alcohol use: Yes   Drug use: Not Currently    Types: Marijuana    Comment: daily use     Allergies   Patient has no known allergies.   Review of Systems Review of Systems  Musculoskeletal:  Positive for back pain.     Physical Exam Triage Vital Signs ED Triage Vitals  Encounter Vitals Group     BP 10/23/23 1043 128/78     Systolic BP Percentile --      Diastolic BP Percentile --      Pulse Rate 10/23/23 1043 76     Resp 10/23/23 1043 16     Temp 10/23/23 1043 98.4 F (36.9 C)     Temp Source 10/23/23 1043 Oral     SpO2 10/23/23 1043 98 %     Weight 10/23/23 1042 144 lb (65.3 kg)     Height 10/23/23 1042 5\' 7"  (1.702 m)     Head Circumference --      Peak Flow --      Pain Score 10/23/23 1041 8     Pain Loc --  Pain Education --      Exclude from Growth Chart --    No data found.  Updated Vital Signs BP 128/78 (BP Location: Right Arm)   Pulse 76   Temp 98.4 F (36.9 C) (Oral)   Resp 16   Ht 5\' 7"  (1.702 m)   Wt 65.3 kg   SpO2 98%   BMI 22.55 kg/m   Visual Acuity Right Eye Distance:   Left Eye Distance:   Bilateral Distance:    Right Eye Near:   Left Eye Near:    Bilateral Near:     Physical Exam Inspection reveals no gross abnormalities of the spine.  There is some tenderness to palpation on the right paraspinal areas.  Right-sided paraspinal also feels hypertonic compared to the left.  No tenderness over the spinous processes.  Range of motion is decreased due to pain with forward flexion and extension.  Straight leg test is negative.  UC Treatments / Results  Labs (all labs ordered are listed, but only abnormal results are displayed) Labs Reviewed - No data to display  EKG   Radiology No results found.  Procedures Procedures (including  critical care time)  Medications Ordered in UC Medications  ketorolac (TORADOL) 30 MG/ML injection 30 mg (30 mg Intramuscular Given 10/23/23 1104)    Initial Impression / Assessment and Plan / UC Course  I have reviewed the triage vital signs and the nursing notes.  Pertinent labs & imaging results that were available during my care of the patient were reviewed by me and considered in my medical decision making (see chart for details).     Patient presenting with lumbar back pain.  X-ray shows interpretation by me shows signs of acute fracture or compression.  Disc height is well-maintained.  No signs of any spondylolisthesis.  Patient likely dealing with lumbar strain/spasm of the right-sided paraspinal muscles.  Will go ahead and do a Toradol shot today.  Will also start patient on meloxicam for the next 10 days.  Patient advised start taking meloxicam later tonight or tomorrow.  Patient also advised to do heat over the affected area and was given exercises that he may start to incorporate once he feels able to.  Patient understanding and agreeable with plan. Final Clinical Impressions(s) / UC Diagnoses   Final diagnoses:  Acute right-sided low back pain without sciatica     Discharge Instructions      Please continue doing meloxicam daily for the next 10 days, please take it with food.  You may start taking your meloxicam tomorrow.  Please apply heat over the area that bothers you.      ED Prescriptions     Medication Sig Dispense Auth. Provider   meloxicam (MOBIC) 15 MG tablet Take 1 tablet (15 mg total) by mouth daily. 14 tablet Brenton Grills, MD   cyclobenzaprine (FLEXERIL) 10 MG tablet Take 1 tablet (10 mg total) by mouth 2 (two) times daily as needed for muscle spasms. 20 tablet Brenton Grills, MD      PDMP not reviewed this encounter.   Brenton Grills, MD 10/23/23 1128    Brenton Grills, MD 10/23/23 [phone]

## 2023-10-23 NOTE — ED Triage Notes (Signed)
 Patient here today with c/o right side LB pain X 2 weeks. Patient states that the pain is worsening. Patient was here last week and was given an injection and a muscle relaxer with no relief. Heat helps a little.

## 2023-12-06 ENCOUNTER — Encounter (HOSPITAL_COMMUNITY): Payer: Self-pay

## 2023-12-06 ENCOUNTER — Ambulatory Visit (HOSPITAL_COMMUNITY)
Admission: RE | Admit: 2023-12-06 | Discharge: 2023-12-06 | Disposition: A | Discharge status: Home / Self Care | Source: Ambulatory Visit | Attending: Family Medicine | Admitting: Family Medicine

## 2023-12-06 VITALS — BP 132/88 | HR 94 | Temp 98.0°F | Resp 14

## 2023-12-06 DIAGNOSIS — Z113 Encounter for screening for infections with a predominantly sexual mode of transmission: Secondary | ICD-10-CM | POA: Insufficient documentation

## 2023-12-06 DIAGNOSIS — Z202 Contact with and (suspected) exposure to infections with a predominantly sexual mode of transmission: Secondary | ICD-10-CM | POA: Diagnosis present

## 2023-12-06 MED ORDER — METRONIDAZOLE 500 MG PO TABS: 2000.0000 mg | ORAL_TABLET | Freq: Once | ORAL | 0 refills | Status: AC

## 2023-12-06 NOTE — ED Provider Notes (Signed)
 [facility]    CSN: [id number] Arrival date & time: 12/06/23  4098      History   Chief Complaint Chief Complaint  Patient presents with   SEXUALLY TRANSMITTED DISEASE    HPI Roberto Goodman is a 43 y.o. male.   Patient is here for exposures to trichomonas.  He is without symptoms today.  He declines blood work at this time.        History reviewed. No pertinent past medical history.  Patient Active Problem List   Diagnosis Date Noted   Hypokalemia 03/23/2016   Fever 03/21/2016   Generalized abdominal pain 03/21/2016   SIRS (systemic inflammatory response syndrome) (HCC) 03/21/2016   Lactic acidosis 03/21/2016   UTI (urinary tract infection) 12/28/2012    Past Surgical History:  Procedure Laterality Date   BACK SURGERY     CERVICAL FUSION         Home Medications    Prior to Admission medications   Medication Sig Start Date End Date Taking? Authorizing Provider  metroNIDAZOLE  (FLAGYL ) 500 MG tablet Take 4 tablets (2,000 mg total) by mouth once for 1 dose. 12/06/23 12/06/23 Yes Lesle Ras, MD    Family History History reviewed. No pertinent family history.  Social History Social History   Tobacco Use   Smoking status: Former    Current packs/day: 0.00    Types: Cigarettes    Quit date: 2013    Years since quitting: 12.3   Smokeless tobacco: Never  Vaping Use   Vaping status: Never Used  Substance Use Topics   Alcohol use: Yes   Drug use: Not Currently    Types: Marijuana    Comment: daily use     Allergies   Patient has no known allergies.   Review of Systems Review of Systems  Constitutional: Negative.   HENT: Negative.    Respiratory: Negative.    Cardiovascular: Negative.   Gastrointestinal: Negative.   Musculoskeletal: Negative.   Psychiatric/Behavioral: Negative.       Physical Exam Triage Vital Signs ED Triage Vitals [12/06/23 0935]  Encounter Vitals Group     BP 132/88     Systolic BP Percentile       Diastolic BP Percentile      Pulse Rate 94     Resp 14     Temp 98 F (36.7 C)     Temp Source Oral     SpO2 98 %     Weight      Height      Head Circumference      Peak Flow      Pain Score 0     Pain Loc      Pain Education      Exclude from Growth Chart    No data found.  Updated Vital Signs BP 132/88 (BP Location: Left Arm)   Pulse 94   Temp 98 F (36.7 C) (Oral)   Resp 14   SpO2 98%   Visual Acuity Right Eye Distance:   Left Eye Distance:   Bilateral Distance:    Right Eye Near:   Left Eye Near:    Bilateral Near:     Physical Exam Constitutional:      Appearance: Normal appearance. He is normal weight.  Cardiovascular:     Rate and Rhythm: Normal rate and regular rhythm.  Pulmonary:     Effort: Pulmonary effort is normal.     Breath sounds: Normal breath sounds.  Neurological:  General: No focal deficit present.     Mental Status: He is alert.  Psychiatric:        Mood and Affect: Mood normal.      UC Treatments / Results  Labs (all labs ordered are listed, but only abnormal results are displayed) Labs Reviewed  CYTOLOGY, (ORAL, ANAL, URETHRAL) ANCILLARY ONLY    EKG   Radiology No results found.  Procedures Procedures (including critical care time)  Medications Ordered in UC Medications - No data to display  Initial Impression / Assessment and Plan / UC Course  I have reviewed the triage vital signs and the nursing notes.  Pertinent labs & imaging results that were available during my care of the patient were reviewed by me and considered in my medical decision making (see chart for details).   Final Clinical Impressions(s) / UC Diagnoses   Final diagnoses:  STD exposure     Discharge Instructions      You were seen today for exposure to trichomonas.  I have sent out flagyl  to treat this.  Your swab will be resulted tomorrow and if anything else is positive you will be notified for treatment.     ED  Prescriptions     Medication Sig Dispense Auth. Provider   metroNIDAZOLE  (FLAGYL ) 500 MG tablet Take 4 tablets (2,000 mg total) by mouth once for 1 dose. 4 tablet Lesle Ras, MD      PDMP not reviewed this encounter.   Lesle Ras, MD 12/06/23 [phone]

## 2023-12-06 NOTE — Discharge Instructions (Addendum)
 You were seen today for exposure to trichomonas.  I have sent out flagyl  to treat this.  Your swab will be resulted tomorrow and if anything else is positive you will be notified for treatment.

## 2023-12-06 NOTE — ED Triage Notes (Signed)
 Patient reports that his sexual partner told him that she was positive for Trich. Patient denies any symptoms.

## 2023-12-07 LAB — CYTOLOGY, (ORAL, ANAL, URETHRAL) ANCILLARY ONLY
Chlamydia: NEGATIVE
Comment: NEGATIVE
Comment: NEGATIVE
Comment: NORMAL
Neisseria Gonorrhea: NEGATIVE
Trichomonas: NEGATIVE

## 2024-01-16 ENCOUNTER — Encounter (HOSPITAL_COMMUNITY): Payer: Self-pay

## 2024-01-16 ENCOUNTER — Ambulatory Visit (HOSPITAL_COMMUNITY)
Admission: RE | Admit: 2024-01-16 | Discharge: 2024-01-16 | Disposition: A | Discharge status: Home / Self Care | Source: Ambulatory Visit | Attending: Physician Assistant | Admitting: Physician Assistant

## 2024-01-16 VITALS — BP 131/86 | HR 71 | Temp 98.3°F | Resp 13

## 2024-01-16 DIAGNOSIS — Z113 Encounter for screening for infections with a predominantly sexual mode of transmission: Secondary | ICD-10-CM | POA: Diagnosis present

## 2024-01-16 NOTE — ED Provider Notes (Signed)
 [facility]    CSN: [id number] Arrival date & time: 01/16/24  1652      History   Chief Complaint Chief Complaint  Patient presents with   Appointment    HPI Roberto Goodman is a 43 y.o. male.   Patient reports today for STD screening.  He reports that his partner was cheating on him and now he has a abnormal sensation when he is urinating.  He denies dysuria.  He has had some penile discharge.  He denies any genital sores or lesions.  He declines blood work today.  The history is provided by the patient.    History reviewed. No pertinent past medical history.  Patient Active Problem List   Diagnosis Date Noted   Hypokalemia 03/23/2016   Fever 03/21/2016   Generalized abdominal pain 03/21/2016   SIRS (systemic inflammatory response syndrome) (HCC) 03/21/2016   Lactic acidosis 03/21/2016   UTI (urinary tract infection) 12/28/2012    Past Surgical History:  Procedure Laterality Date   BACK SURGERY     CERVICAL FUSION         Home Medications    Prior to Admission medications   Not on File    Family History History reviewed. No pertinent family history.  Social History Social History   Tobacco Use   Smoking status: Former    Current packs/day: 0.00    Types: Cigarettes    Quit date: 2013    Years since quitting: 12.4   Smokeless tobacco: Never  Vaping Use   Vaping status: Never Used  Substance Use Topics   Alcohol use: Yes   Drug use: Not Currently    Types: Marijuana    Comment: daily use     Allergies   Patient has no known allergies.   Review of Systems Review of Systems  Constitutional:  Negative for chills and fever.  Eyes:  Negative for discharge and redness.  Genitourinary:  Positive for penile discharge. Negative for dysuria and genital sores.  Neurological:  Negative for numbness.     Physical Exam Triage Vital Signs ED Triage Vitals  Encounter Vitals Group     BP      Systolic BP Percentile      Diastolic  BP Percentile      Pulse      Resp      Temp      Temp src      SpO2      Weight      Height      Head Circumference      Peak Flow      Pain Score      Pain Loc      Pain Education      Exclude from Growth Chart    No data found.  Updated Vital Signs BP 131/86 (BP Location: Left Arm)   Pulse 71   Temp 98.3 F (36.8 C)   Resp 13   SpO2 98%   Visual Acuity Right Eye Distance:   Left Eye Distance:   Bilateral Distance:    Right Eye Near:   Left Eye Near:    Bilateral Near:     Physical Exam Vitals and nursing note reviewed.  Constitutional:      General: He is not in acute distress.    Appearance: Normal appearance. He is not ill-appearing.  HENT:     Head: Normocephalic and atraumatic.  Eyes:     Conjunctiva/sclera: Conjunctivae normal.  Cardiovascular:  Rate and Rhythm: Normal rate.  Pulmonary:     Effort: Pulmonary effort is normal. No respiratory distress.  Neurological:     Mental Status: He is alert.  Psychiatric:        Mood and Affect: Mood normal.        Behavior: Behavior normal.        Thought Content: Thought content normal.      UC Treatments / Results  Labs (all labs ordered are listed, but only abnormal results are displayed) Labs Reviewed  CYTOLOGY, (ORAL, ANAL, URETHRAL) ANCILLARY ONLY    EKG   Radiology No results found.  Procedures Procedures (including critical care time)  Medications Ordered in UC Medications - No data to display  Initial Impression / Assessment and Plan / UC Course  I have reviewed the triage vital signs and the nursing notes.  Pertinent labs & imaging results that were available during my care of the patient were reviewed by me and considered in my medical decision making (see chart for details).    STD screening ordered as requested.  Advised to abstain from sex while waiting for results.  Encouraged follow-up with any further concerns.  Final Clinical Impressions(s) / UC Diagnoses   Final  diagnoses:  Screening examination for STD (sexually transmitted disease)   Discharge Instructions   None    ED Prescriptions   None    PDMP not reviewed this encounter.   Vernestine Gondola, PA-C 01/16/24 1753

## 2024-01-16 NOTE — ED Triage Notes (Signed)
 Pt reports that his partner was cheating on him and had "funny feeling when use the bathroom", also having discharge.

## 2024-01-17 ENCOUNTER — Telehealth (HOSPITAL_COMMUNITY): Payer: Self-pay | Admitting: Emergency Medicine

## 2024-01-17 LAB — CYTOLOGY, (ORAL, ANAL, URETHRAL) ANCILLARY ONLY
Chlamydia: NEGATIVE
Comment: NEGATIVE
Comment: NEGATIVE
Comment: NORMAL
Neisseria Gonorrhea: NEGATIVE
Trichomonas: NEGATIVE

## 2024-01-17 NOTE — Telephone Encounter (Signed)
 This RN returned patient's call about medication questions. Pt informed that his lab results are still in process so no new for prescription medications at this time. Patient asked about checking for his results on Mychart. Informed patient that he would be able to see the results once they are back. Informed patient that he would get a phone call from us  if there is anything abnormal and what medication he would be prescribed. Patient verbalized understanding.

## 2024-02-06 ENCOUNTER — Ambulatory Visit (HOSPITAL_COMMUNITY)
